# Patient Record
Sex: Male | Born: 1990
Health system: Southern US, Community
[De-identification: ages and names within clinical notes are randomized; demographics above are authoritative.]

## PROBLEM LIST (undated history)

## (undated) DIAGNOSIS — E1143 Type 2 diabetes mellitus with diabetic autonomic (poly)neuropathy: Secondary | ICD-10-CM

## (undated) DIAGNOSIS — E559 Vitamin D deficiency, unspecified: Secondary | ICD-10-CM

## (undated) DIAGNOSIS — K3184 Gastroparesis: Secondary | ICD-10-CM

## (undated) DIAGNOSIS — M419 Scoliosis, unspecified: Secondary | ICD-10-CM

## (undated) DIAGNOSIS — F329 Major depressive disorder, single episode, unspecified: Secondary | ICD-10-CM

## (undated) DIAGNOSIS — G629 Polyneuropathy, unspecified: Secondary | ICD-10-CM

## (undated) DIAGNOSIS — F32A Depression, unspecified: Secondary | ICD-10-CM

## (undated) HISTORY — DX: Gastroparesis: K31.84

## (undated) HISTORY — DX: Type 2 diabetes mellitus with diabetic autonomic (poly)neuropathy: E11.43

---
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
## 2012-02-15 ENCOUNTER — Inpatient Hospital Stay (HOSPITAL_BASED_OUTPATIENT_CLINIC_OR_DEPARTMENT_OTHER)
Admission: EM | Admit: 2012-02-15 | Discharge: 2012-02-18 | DRG: 919 | Disposition: A | Discharge status: Home / Self Care | Payer: Managed Care, Other (non HMO) | Attending: Internal Medicine | Admitting: Internal Medicine

## 2012-02-15 ENCOUNTER — Encounter (HOSPITAL_BASED_OUTPATIENT_CLINIC_OR_DEPARTMENT_OTHER): Payer: Self-pay | Admitting: Emergency Medicine

## 2012-02-15 DIAGNOSIS — F329 Major depressive disorder, single episode, unspecified: Secondary | ICD-10-CM

## 2012-02-15 DIAGNOSIS — E1069 Type 1 diabetes mellitus with other specified complication: Secondary | ICD-10-CM | POA: Diagnosis not present

## 2012-02-15 DIAGNOSIS — E1065 Type 1 diabetes mellitus with hyperglycemia: Secondary | ICD-10-CM | POA: Diagnosis not present

## 2012-02-15 DIAGNOSIS — E1010 Type 1 diabetes mellitus with ketoacidosis without coma: Secondary | ICD-10-CM | POA: Diagnosis present

## 2012-02-15 DIAGNOSIS — D72829 Elevated white blood cell count, unspecified: Secondary | ICD-10-CM | POA: Diagnosis present

## 2012-02-15 DIAGNOSIS — F32A Depression, unspecified: Secondary | ICD-10-CM

## 2012-02-15 DIAGNOSIS — Z794 Long term (current) use of insulin: Secondary | ICD-10-CM

## 2012-02-15 DIAGNOSIS — IMO0002 Reserved for concepts with insufficient information to code with codable children: Secondary | ICD-10-CM | POA: Diagnosis not present

## 2012-02-15 DIAGNOSIS — E1110 Type 2 diabetes mellitus with ketoacidosis without coma: Secondary | ICD-10-CM

## 2012-02-15 DIAGNOSIS — E871 Hypo-osmolality and hyponatremia: Secondary | ICD-10-CM | POA: Diagnosis present

## 2012-02-15 DIAGNOSIS — E1049 Type 1 diabetes mellitus with other diabetic neurological complication: Secondary | ICD-10-CM | POA: Diagnosis present

## 2012-02-15 DIAGNOSIS — F17200 Nicotine dependence, unspecified, uncomplicated: Secondary | ICD-10-CM | POA: Diagnosis present

## 2012-02-15 DIAGNOSIS — F332 Major depressive disorder, recurrent severe without psychotic features: Secondary | ICD-10-CM | POA: Diagnosis present

## 2012-02-15 DIAGNOSIS — E876 Hypokalemia: Secondary | ICD-10-CM | POA: Clinically undetermined

## 2012-02-15 DIAGNOSIS — T85694A Other mechanical complication of insulin pump, initial encounter: Principal | ICD-10-CM | POA: Diagnosis present

## 2012-02-15 DIAGNOSIS — Y849 Medical procedure, unspecified as the cause of abnormal reaction of the patient, or of later complication, without mention of misadventure at the time of the procedure: Secondary | ICD-10-CM | POA: Diagnosis present

## 2012-02-15 HISTORY — DX: Scoliosis, unspecified: M41.9

## 2012-02-15 HISTORY — DX: Depression, unspecified: F32.A

## 2012-02-15 HISTORY — DX: Major depressive disorder, single episode, unspecified: F32.9

## 2012-02-15 LAB — GLUCOSE, CAPILLARY
Glucose-Capillary: >600 mg/dL (ref 70–99)
Glucose-Capillary: 218 mg/dL — ABNORMAL HIGH (ref 70–99)
Glucose-Capillary: 126 mg/dL — ABNORMAL HIGH (ref 70–99)
Glucose-Capillary: 163 mg/dL — ABNORMAL HIGH (ref 70–99)
Glucose-Capillary: 381 mg/dL — ABNORMAL HIGH (ref 70–99)
Glucose-Capillary: 385 mg/dL — ABNORMAL HIGH (ref 70–99)
Glucose-Capillary: 315 mg/dL — ABNORMAL HIGH (ref 70–99)
Glucose-Capillary: 274 mg/dL — ABNORMAL HIGH (ref 70–99)

## 2012-02-15 LAB — BASIC METABOLIC PANEL
BUN: 14 mg/dL (ref 6–23)
BUN: 11 mg/dL (ref 6–23)
CO2: 12 mEq/L — ABNORMAL LOW (ref 19–32)
CO2: 15 mEq/L — ABNORMAL LOW (ref 19–32)
CO2: 19 mEq/L (ref 19–32)
CO2: 19 mEq/L (ref 19–32)
CO2: 17 mEq/L — ABNORMAL LOW (ref 19–32)
CO2: 17 mEq/L — ABNORMAL LOW (ref 19–32)
CO2: 21 mEq/L (ref 19–32)
CO2: 23 mEq/L (ref 19–32)
Calcium: 8.6 mg/dL (ref 8.4–10.5)
Calcium: 8 mg/dL — ABNORMAL LOW (ref 8.4–10.5)
Calcium: 8.5 mg/dL (ref 8.4–10.5)
Calcium: 8.6 mg/dL (ref 8.4–10.5)
Calcium: 8.5 mg/dL (ref 8.4–10.5)
Chloride: 82 mEq/L — ABNORMAL LOW (ref 96–112)
Chloride: 104 mEq/L (ref 96–112)
Chloride: 103 mEq/L (ref 96–112)
Chloride: 103 mEq/L (ref 96–112)
Chloride: 98 mEq/L (ref 96–112)
Chloride: 101 mEq/L (ref 96–112)
Chloride: 106 mEq/L (ref 96–112)
Creatinine, Ser: 0.56 mg/dL (ref 0.50–1.35)
Creatinine, Ser: 0.64 mg/dL (ref 0.50–1.35)
GFR calc Af Amer: >90 mL/min (ref >90)
GFR calc Af Amer: >90 mL/min (ref >90)
GFR calc Af Amer: >90 mL/min (ref >90)
GFR calc non Af Amer: >90 mL/min (ref >90)
GFR calc non Af Amer: >90 mL/min (ref >90)
GFR calc non Af Amer: >90 mL/min (ref >90)
GFR calc non Af Amer: >90 mL/min (ref >90)
Glucose, Bld: 191 mg/dL — ABNORMAL HIGH (ref 70–99)
Glucose, Bld: 171 mg/dL — ABNORMAL HIGH (ref 70–99)
Glucose, Bld: 145 mg/dL — ABNORMAL HIGH (ref 70–99)
Glucose, Bld: 382 mg/dL — ABNORMAL HIGH (ref 70–99)
Potassium: 5 mEq/L (ref 3.5–5.1)
Potassium: 3.3 mEq/L — ABNORMAL LOW (ref 3.5–5.1)
Potassium: 3.5 mEq/L (ref 3.5–5.1)
Potassium: 4.1 mEq/L (ref 3.5–5.1)
Potassium: 4.2 mEq/L (ref 3.5–5.1)
Potassium: 3.5 mEq/L (ref 3.5–5.1)
Potassium: 3.1 mEq/L — ABNORMAL LOW (ref 3.5–5.1)
Potassium: 3.4 mEq/L — ABNORMAL LOW (ref 3.5–5.1)
Sodium: 127 mEq/L — ABNORMAL LOW (ref 135–145)
Sodium: 132 mEq/L — ABNORMAL LOW (ref 135–145)
Sodium: 133 mEq/L — ABNORMAL LOW (ref 135–145)
Sodium: 128 mEq/L — ABNORMAL LOW (ref 135–145)
Sodium: 127 mEq/L — ABNORMAL LOW (ref 135–145)
Sodium: 130 mEq/L — ABNORMAL LOW (ref 135–145)
Sodium: 132 mEq/L — ABNORMAL LOW (ref 135–145)
Sodium: 138 mEq/L (ref 135–145)

## 2012-02-15 LAB — CBC WITH DIFFERENTIAL/PLATELET
Basophils Relative: 0 % (ref 0–1)
Eosinophils Absolute: 0 10*3/uL (ref 0.0–0.7)
Eosinophils Relative: 0 % (ref 0–5)
Hemoglobin: 13.7 g/dL (ref 13.0–17.0)
MCH: 29.9 pg (ref 26.0–34.0)
MCHC: 35.4 g/dL (ref 30.0–36.0)
MCV: 84.5 fL (ref 78.0–100.0)
Monocytes Relative: 6 % (ref 3–12)
Neutrophils Relative %: 82 % — ABNORMAL HIGH (ref 43–77)
Platelets: 326 10*3/uL (ref 150–400)

## 2012-02-15 LAB — URINALYSIS, ROUTINE W REFLEX MICROSCOPIC
Bilirubin Urine: NEGATIVE
Glucose, UA: >1000 mg/dL — AB
Hgb urine dipstick: NEGATIVE
Ketones, ur: >80 mg/dL — AB
Leukocytes, UA: NEGATIVE
Nitrite: NEGATIVE
Protein, ur: NEGATIVE mg/dL
Specific Gravity, Urine: 1.028 (ref 1.005–1.030)
Urobilinogen, UA: 0.2 mg/dL (ref 0.0–1.0)
pH: 5 (ref 5.0–8.0)

## 2012-02-15 LAB — POCT I-STAT 3, VENOUS BLOOD GAS (G3P V)
Patient temperature: 98.7
pH, Ven: 7.153 — CL (ref 7.250–7.300)

## 2012-02-15 LAB — URINE MICROSCOPIC-ADD ON

## 2012-02-15 LAB — CARDIAC PANEL(CRET KIN+CKTOT+MB+TROPI): CK, MB: 1.4 ng/mL (ref 0.3–4.0)

## 2012-02-15 MED ORDER — ENOXAPARIN SODIUM 40 MG/0.4ML ~~LOC~~ SOLN
40.0000 mg | SUBCUTANEOUS | Status: DC
  Administered 2012-02-15 – 2012-02-17 (×3): 40 mg via SUBCUTANEOUS
  Filled 2012-02-15 (×5): qty 0.4

## 2012-02-15 MED ORDER — POTASSIUM CHLORIDE 10 MEQ/100ML IV SOLN: 10.0000 meq | INTRAVENOUS | Status: DC

## 2012-02-15 MED ORDER — DEXTROSE 50 % IV SOLN: 25.0000 mL | INTRAVENOUS | Status: DC | PRN

## 2012-02-15 MED ORDER — SODIUM CHLORIDE 0.9 % IV SOLN: INTRAVENOUS | Status: DC

## 2012-02-15 MED ORDER — POTASSIUM CHLORIDE 10 MEQ/100ML IV SOLN
10.0000 meq | INTRAVENOUS | Status: AC
Start: 2012-02-15 — End: 2012-02-15
  Administered 2012-02-15 (×2): 10 meq via INTRAVENOUS
  Filled 2012-02-15 (×2): qty 100

## 2012-02-15 MED ORDER — INSULIN ASPART 100 UNIT/ML ~~LOC~~ SOLN
0.0000 [IU] | Freq: Three times a day (TID) | SUBCUTANEOUS | Status: DC
  Administered 2012-02-15: 3 [IU] via SUBCUTANEOUS

## 2012-02-15 MED ORDER — ONDANSETRON HCL 4 MG/2ML IJ SOLN: 4.0000 mg | Freq: Three times a day (TID) | INTRAMUSCULAR | Status: DC | PRN

## 2012-02-15 MED ORDER — INSULIN REGULAR HUMAN 100 UNIT/ML IJ SOLN
INTRAMUSCULAR | Status: AC
  Filled 2012-02-15: qty 1

## 2012-02-15 MED ORDER — SODIUM CHLORIDE 0.9 % IV SOLN
INTRAVENOUS | Status: DC
  Administered 2012-02-15: 5.4 [IU]/h via INTRAVENOUS

## 2012-02-15 MED ORDER — SODIUM CHLORIDE 0.9 % IV SOLN
INTRAVENOUS | Status: AC
  Administered 2012-02-15: 05:00:00 via INTRAVENOUS

## 2012-02-15 MED ORDER — ACETAMINOPHEN 325 MG PO TABS
650.0000 mg | ORAL_TABLET | Freq: Four times a day (QID) | ORAL | Status: DC | PRN
  Administered 2012-02-15 – 2012-02-16 (×3): 650 mg via ORAL
  Filled 2012-02-15 (×3): qty 2

## 2012-02-15 MED ORDER — INSULIN GLARGINE 100 UNIT/ML ~~LOC~~ SOLN: 10.0000 [IU] | Freq: Every day | SUBCUTANEOUS | Status: DC

## 2012-02-15 MED ORDER — SODIUM CHLORIDE 0.9 % IV SOLN
INTRAVENOUS | Status: DC
  Administered 2012-02-15: 3.3 [IU]/h via INTRAVENOUS
  Filled 2012-02-15: qty 1

## 2012-02-15 MED ORDER — DEXTROSE-NACL 5-0.45 % IV SOLN: INTRAVENOUS | Status: DC

## 2012-02-15 MED ORDER — SODIUM CHLORIDE 0.9 % IV BOLUS (SEPSIS)
2000.0000 mL | Freq: Once | INTRAVENOUS | Status: AC
  Administered 2012-02-15: 2000 mL via INTRAVENOUS

## 2012-02-15 MED ORDER — INSULIN ASPART 100 UNIT/ML ~~LOC~~ SOLN: 0.0000 [IU] | Freq: Every day | SUBCUTANEOUS | Status: DC

## 2012-02-15 MED ORDER — ONDANSETRON HCL 4 MG/2ML IJ SOLN: 4.0000 mg | Freq: Four times a day (QID) | INTRAMUSCULAR | Status: DC | PRN

## 2012-02-15 MED ORDER — SODIUM CHLORIDE 0.9 % IV SOLN
INTRAVENOUS | Status: DC
  Administered 2012-02-15: 125 mL/h via INTRAVENOUS
  Administered 2012-02-16: 02:00:00 via INTRAVENOUS
  Administered 2012-02-16: 75 mL/h via INTRAVENOUS
  Administered 2012-02-17: 03:00:00 via INTRAVENOUS
  Administered 2012-02-17: 75 mL/h via INTRAVENOUS

## 2012-02-15 MED ORDER — DEXTROSE-NACL 5-0.45 % IV SOLN
INTRAVENOUS | Status: DC
  Administered 2012-02-15: 06:00:00 via INTRAVENOUS

## 2012-02-15 MED ORDER — DEXTROSE 50 % IV SOLN
25.0000 mL | INTRAVENOUS | Status: DC | PRN
  Filled 2012-02-15: qty 50

## 2012-02-15 NOTE — ED Provider Notes (Signed)
History     CSN: [id number]  Arrival date & time 02/15/12  0101   First MD Initiated Contact with Patient 02/15/12 0113      Chief Complaint  Patient presents with  . Blood Sugar Problem    (Consider location/radiation/quality/duration/timing/severity/associated sxs/prior treatment) HPI This is a 21 year old white male with a bout 11 year history of type 1 diabetes. He has an insulin pump and he states it didn't attach itself from him during sleep probably several hours ago. He woke up about an hour ago with nausea and vomiting consistent with prior episodes of hyperglycemia. The sugar was noted to read off scale Hy arrival. He states he has a dry mouth and feels dehydrated. He has had frequent urination and generalized aches. He is also noted to be tachycardic. He denies abdominal pain.  Past Medical History  Diagnosis Date  . Diabetes mellitus   . Depression   . Scoliosis     History reviewed. No pertinent past surgical history.  No family history on file.  History  Substance Use Topics  . Smoking status: Current Everyday Smoker  . Smokeless tobacco: Not on file  . Alcohol Use: No      Review of Systems  All other systems reviewed and are negative.    Allergies  Review of patient's allergies indicates no known allergies.  Home Medications   Current Outpatient Rx  Name Route Sig Dispense Refill  . INSULIN ASPART 100 UNIT/ML Johnson SOLN Subcutaneous Inject into the skin.      BP 128/83  Pulse 124  Temp 97.5 F (36.4 C) (Oral)  Resp 18  Ht 5\' 9"  (1.753 m)  Wt 125 lb (56.7 kg)  BMI 18.46 kg/m2  SpO2 100%  Physical Exam General: Well-developed, well-nourished male in no acute distress; appearance consistent with age of record HENT: normocephalic, atraumatic; dry mucous membranes Eyes: pupils equal round and reactive to light; extraocular muscles intact Neck: supple Heart: regular rate and rhythm; tachycardic Lungs: clear to auscultation  bilaterally Abdomen: soft; nondistended; nontender; bowel sounds present Extremities: No deformity; full range of motion; pulses normal; no edema Neurologic: Awake, alert and oriented; motor function intact in all extremities and symmetric; no facial droop Skin: Warm and dry Psychiatric: Normal mood and affect    ED Course  Procedures (including critical care time)    MDM   Nursing notes and vitals signs, including pulse oximetry, reviewed.  Summary of this visit's results, reviewed by myself:  Labs:  Results for orders placed during the hospital encounter of 02/15/12  URINALYSIS, ROUTINE W REFLEX MICROSCOPIC      Component Value Range   Color, Urine YELLOW  YELLOW   APPearance CLEAR  CLEAR   Specific Gravity, Urine 1.028  1.005 - 1.030   pH 5.0  5.0 - 8.0   Glucose, UA >1000 (*) NEGATIVE mg/dL   Hgb urine dipstick NEGATIVE  NEGATIVE   Bilirubin Urine NEGATIVE  NEGATIVE   Ketones, ur >80 (*) NEGATIVE mg/dL   Protein, ur NEGATIVE  NEGATIVE mg/dL   Urobilinogen, UA 0.2  0.0 - 1.0 mg/dL   Nitrite NEGATIVE  NEGATIVE   Leukocytes, UA NEGATIVE  NEGATIVE  GLUCOSE, CAPILLARY      Component Value Range   Glucose-Capillary >600 (*) 70 - 99 mg/dL  URINE MICROSCOPIC-ADD ON      Component Value Range   Squamous Epithelial / LPF RARE  RARE   WBC, UA 0-2  <3 WBC/hpf   RBC / HPF 0-2  <3  RBC/hpf   Bacteria, UA RARE  RARE  POCT I-STAT 3, BLOOD GAS (G3P V)      Component Value Range   pH, Ven 7.153 (*) 7.250 - 7.300   pCO2, Ven 36.0 (*) 45.0 - 50.0 mmHg   pO2, Ven 36.0  30.0 - 45.0 mmHg   Bicarbonate 12.6 (*) 20.0 - 24.0 mEq/L   TCO2 14  0 - 100 mmol/L   O2 Saturation 53.0     Acid-base deficit 15.0 (*) 0.0 - 2.0 mmol/L   Patient temperature 98.7 F     Collection site IV START     Drawn by Nurse     Sample type VENOUS     Comment NOTIFIED PHYSICIAN     1:54 AM IV fluid bolus and insulin drip initiated for treatment of DKA.          Hanley Seamen, MD 02/15/12  [phone]

## 2012-02-15 NOTE — Progress Notes (Signed)
Glucose stabilizer restarted per MD order; CBG 385. Will continue to monitor.

## 2012-02-15 NOTE — Progress Notes (Signed)
Utilization review completed.  

## 2012-02-15 NOTE — Progress Notes (Addendum)
Inpatient Diabetes Program Recommendations  AACE/ADA: New Consensus Statement on Inpatient Glycemic Control (2013)  Target Ranges:  Prepandial:   less than 140 mg/dL      Peak postprandial:   less than 180 mg/dL (1-2 hours)      Critically ill patients:  140 - 180 mg/dL   Reason for Visit: Admitted in DKA.  Uses insulin pump at home.  Inpatient Diabetes Program Recommendations HgbA1C: No value known.  Request MD order.  Note: Patient has insulin pump in room.  Has no pump supplies.  Per patient, pump infusion set came out when he laid down last night.  When he got up about 10 PM was feeling nauseated.  (Had laid down about 7 PM)  Was not at home-- was at his cousin's, and he had no pump supplies.  His cousin will obtain supplies from patient's father and bring them today after he gets off work at 3:30 pm.  Patient currently does not have an endocrinologist.  Used to see the only endocrinologist in [place], but doesn't remember his name.  He is supposed to begin seeing another one, but does not remember that one's name either.  His PCP is Dr. Manson Passey in [place], [place].    Patient states that he has problems keeping his infusion sets in place.  Uses alcohol prep and IV prep prior to inserting set.  Nursing staff will give patient some Skin Prep to try with skin preparation whenever he is ready to resume pump.  Demonstrated to patient how to make a stress/safety loop which may help keep sets from pulling out so easily.  Last B-Met done at 1100.  GAP was 13 and CO2 was 17.  MD has ordered another B-met to check status.  Jolanta Cabeza S. Bailei Buist, RN, CNS, CDE  [phone])   Insulin pump settings are as follows:  Basal rates- 12 midnight =  0.75 units/hr and 7 am = 1.15 units/hr for a total of 20.075 units per 24 hours.  Insulin to CHO ratio is 1:8, sensitivity factor is 25, and target is 100 mg/dl. Eddye Broxterman S. Elsie Lincoln, RN, CNS, CDE  [phone])

## 2012-02-15 NOTE — H&P (Signed)
[facility] History and Physical  Jake Howell ZOX:[id number] DOB: 09/19/1990 DOA: 02/15/2012  Referring physician: [place] PCP: No primary provider on file.   Chief Complaint: dka  HPI:  21 yo male with dm1 had lost the attachment to his insulin pump and could not use it therefore went into dka.  + nv, went ot med ctr [facility] and transferred to [facility] for DKA.   Review of Systems:  Negative for all organ systems except for + above  Past Medical History  Diagnosis Date  . Diabetes mellitus   . Scoliosis   . Depression    History reviewed. No pertinent past surgical history. Social History:  reports that he has been smoking.  He does not have any smokeless tobacco history on file. He reports that he does not drink alcohol or use illicit drugs. Lives with parents Independent in ADLS  No Known Allergies  Family History  Problem Relation Age of Onset  . Crohn's disease Father     (be sure to complete)  Prior to Admission medications   Medication Sig Start Date End Date Taking? Authorizing Provider  insulin aspart (NOVOLOG) 100 UNIT/ML injection Inject into the skin.   Yes Historical Provider, MD   Physical Exam: Filed Vitals:   02/15/12 0106 02/15/12 0255 02/15/12 0359  BP: 128/83 120/73 116/67  Pulse: 124 108 117  Temp: 97.5 F (36.4 C) 98.4 F (36.9 C) 98.8 F (37.1 C)  TempSrc: Oral  Oral  Resp: 18 18 17   Height: 5\' 9"  (1.753 m)  5\' 9"  (1.753 m)  Weight: 56.7 kg (125 lb)  56.8 kg (125 lb 3.5 oz)  SpO2: 100%  100%     General: nad  Eyes: anicteric  ENT: mm dry  Neck: no jvd, no bruit, no tm, no adenopathy  Cardiovascular: rrr s1, s2  Respiratory: ctab  Abdomen: soft, nt, n,d, +bs  Skin: no rash, no c/c/e  Musculoskeletal: wnl  Psychiatric:axox3  Neurologic: nonfocal  Labs on Admission:  Basic Metabolic Panel:  Lab 02/15/12 8119  NA 127*  K 5.0  CL 82*  CO2 12*  GLUCOSE 751*  BUN 17  CREATININE 0.80  CALCIUM 9.7  MG --  PHOS --    Liver Function Tests: No results found for this basename: AST:5,ALT:5,ALKPHOS:5,BILITOT:5,PROT:5,ALBUMIN:5 in the last 168 hours No results found for this basename: LIPASE:5,AMYLASE:5 in the last 168 hours No results found for this basename: AMMONIA:5 in the last 168 hours CBC: No results found for this basename: WBC:5,NEUTROABS:5,HGB:5,HCT:5,MCV:5,PLT:5 in the last 168 hours Cardiac Enzymes: No results found for this basename: CKTOTAL:5,CKMB:5,CKMBINDEX:5,TROPONINI:5 in the last 168 hours  BNP (last 3 results) No results found for this basename: PROBNP:3 in the last 8760 hours CBG:  Lab 02/15/12 0244 02/15/12 0114  GLUCAP >600* >600*    Radiological Exams on Admission: No results found.  EKG:  Assessment/Plan Principal Problem:  *DKA (diabetic ketoacidoses)   1. DKA:  Iv insulin, ns iv   2. Pseudohyponatremia:   3. N/v:  zofran prn  Code Status: full code Family Communication: Leavy Cella  Disposition Plan: home  Time spent:  Keigo Whalley [facility] Pager [phone]  If 7PM-7AM, please contact night-coverage [email or web address] Password [facility] 02/15/2012, 4:38 AM

## 2012-02-15 NOTE — ED Notes (Signed)
Pt states his insulin pump ran out of insulin. Pt states blood sugar is elevated. Pt c/o nausea and vomiting.

## 2012-02-15 NOTE — Progress Notes (Signed)
Patient's CBG 60, per glucose stabilizer protocol 1 amp D50 given and MD notified.  Per MD glucose stabilizer d/c'd and other new orders received.  Will continue to monitor.

## 2012-02-15 NOTE — Progress Notes (Signed)
[facility]    I have examined the patient and evaluated his chart and admission history. Patient admitted early this morning with DKA due to problems with Insulin Pump- As he had and episode of hypoglycemia this AM, his IV insulin infusion was discontinued per the DKA protocol-  however due to inability to restart his insulin pump, the subsequent anion gap was noted to be increasing-  we have decided to resume the insulin infusion and DKA protocol until his personal pump supplies are brought to the hospital. Patient is in agreement with this plan.   Calvert Cantor, MD [phone]

## 2012-02-15 NOTE — ED Notes (Signed)
Pt is from out of town and thought he had more insulin in pump.

## 2012-02-16 DIAGNOSIS — E1049 Type 1 diabetes mellitus with other diabetic neurological complication: Secondary | ICD-10-CM | POA: Diagnosis present

## 2012-02-16 DIAGNOSIS — E876 Hypokalemia: Secondary | ICD-10-CM

## 2012-02-16 DIAGNOSIS — E1010 Type 1 diabetes mellitus with ketoacidosis without coma: Secondary | ICD-10-CM

## 2012-02-16 DIAGNOSIS — E1110 Type 2 diabetes mellitus with ketoacidosis without coma: Secondary | ICD-10-CM

## 2012-02-16 LAB — BASIC METABOLIC PANEL
CO2: 22 mEq/L (ref 19–32)
Chloride: 104 mEq/L (ref 96–112)
Creatinine, Ser: 0.53 mg/dL (ref 0.50–1.35)
Glucose, Bld: 142 mg/dL — ABNORMAL HIGH (ref 70–99)

## 2012-02-16 LAB — GLUCOSE, CAPILLARY
Glucose-Capillary: 105 mg/dL — ABNORMAL HIGH (ref 70–99)
Glucose-Capillary: 118 mg/dL — ABNORMAL HIGH (ref 70–99)
Glucose-Capillary: 121 mg/dL — ABNORMAL HIGH (ref 70–99)
Glucose-Capillary: 204 mg/dL — ABNORMAL HIGH (ref 70–99)
Glucose-Capillary: 227 mg/dL — ABNORMAL HIGH (ref 70–99)
Glucose-Capillary: 232 mg/dL — ABNORMAL HIGH (ref 70–99)
Glucose-Capillary: 62 mg/dL — ABNORMAL LOW (ref 70–99)

## 2012-02-16 MED ORDER — POTASSIUM CHLORIDE CRYS ER 20 MEQ PO TBCR
40.0000 meq | EXTENDED_RELEASE_TABLET | Freq: Two times a day (BID) | ORAL | Status: AC
  Administered 2012-02-16 – 2012-02-17 (×3): 40 meq via ORAL
  Filled 2012-02-16 (×4): qty 2

## 2012-02-16 MED ORDER — OXYCODONE HCL 5 MG PO TABS
5.0000 mg | ORAL_TABLET | ORAL | Status: DC | PRN
  Administered 2012-02-16 – 2012-02-17 (×9): 5 mg via ORAL
  Administered 2012-02-18: 10 mg via ORAL
  Filled 2012-02-16 (×8): qty 1
  Filled 2012-02-16: qty 2
  Filled 2012-02-16: qty 1

## 2012-02-16 MED ORDER — INSULIN PUMP
Freq: Three times a day (TID) | SUBCUTANEOUS | Status: DC
  Administered 2012-02-16: 10.01 via SUBCUTANEOUS
  Administered 2012-02-16: 7.7 via SUBCUTANEOUS
  Administered 2012-02-17: 08:00:00 via SUBCUTANEOUS
  Filled 2012-02-16: qty 1

## 2012-02-16 NOTE — Progress Notes (Signed)
Patient transferred to [facility] room 26 via wheelchair with NT; report called to RN on 6N and all questions answered.  Patient's assessment WNL and VSS.  Patient's family at bedside.

## 2012-02-16 NOTE — Progress Notes (Signed)
Patient transferred to [facility] room 26. Alert and oriented x3. NS infusing.  Medicated for pain in left shoulder prior to transfer. Oriented to room. Call bell in reach.

## 2012-02-16 NOTE — Progress Notes (Signed)
Hypoglycemic Event  CBG:62  Treatment: 15 GM carbohydrate snack  Symptoms: Shaky  Follow-up CBG: Time:0420 CBG Result:78  Possible Reasons for Event: Unknown  Comments/MD notified:snack given    Jake Howell  Remember to initiate Hypoglycemia Order Set & complete

## 2012-02-16 NOTE — Progress Notes (Signed)
[facility] Progress Note [place] TEAM 1 - Stepdown/ICU TEAM   Jake Howell ZOX:[id number] DOB: 10-23-1990 DOA: 02/15/2012 PCP: Dr. Manson Passey in [place]  Brief narrative: 21 yo male with dm1 had lost the attachment to his insulin pump and could not use it therefore went into dka - + nv, went to med ctr [facility] and transferred to [facility] for DKA.   Assessment/Plan:  DKA Gap is closed and did so rapidly with IV insulin - bicarbonate has normalized - the patient is no longer on the IV insulin drip - the cause of his DKA was noncompliance with insulin therapy as detailed above  Diabetes mellitus type 1 The patient is no longer in DKA but his CBGs remain somewhat erratic - we will transfer him to a medical bed but watch his CBGs overnight with resumption of his insulin pump to assure that everything is working properly  Hypokalemia Due to DKA and its treatment - replace and followup in a.m.  Migratory bodyache Will check CK total as patient would be a risk for rhabdo related to DKA  Depression  Code Status: Full Disposition Plan: Transfer to a medical bed  Consultants: None  Procedures: None  Antibiotics: None  DVT prophylaxis: SCDs  HPI/Subjective: The patient complains of migratory bodyaches to include right shoulder right arm and left lower extremity.  He describes these as achy.  He denies chest pain shortness of breath fevers chills nausea vomiting or abdominal pain.   Objective: Blood pressure 128/92, pulse 75, temperature 97.8 F (36.6 C), temperature source Oral, resp. rate 18, height 5\' 9"  (1.753 m), weight 56.8 kg (125 lb 3.5 oz), SpO2 100.00%.  Intake/Output Summary (Last 24 hours) at 02/16/12 1157 Last data filed at 02/16/12 1000  Gross per 24 hour  Intake    750 ml  Output      0 ml  Net    750 ml     Exam: General: No acute respiratory distress Lungs: Clear to auscultation bilaterally without wheezes or crackles Cardiovascular: Regular rate and  rhythm without murmur gallop or rub normal S1 and S2 Abdomen: Nontender, nondistended, soft, bowel sounds positive, no rebound, no ascites, no appreciable mass Extremities: No significant cyanosis, clubbing, or edema bilateral lower extremities  Data Reviewed: Basic Metabolic Panel:  Lab 02/16/12 8119 02/15/12 2309 02/15/12 2121 02/15/12 1949 02/15/12 1739  NA 138 138 132* 130* 127*  K 3.0* 3.4* 3.1* 3.5 4.2  CL 104 106 101 98 92*  CO2 22 23 21  17* 15*  GLUCOSE 142* 58* 130* 298* 409*  BUN 11 11 11 11 12   CREATININE 0.53 0.73 0.60 0.74 0.74  CALCIUM 8.1* 8.5 8.8 8.8 8.6  MG -- -- -- -- --  PHOS -- -- -- -- --   CBC:  Lab 02/15/12 0500  WBC 18.6*  NEUTROABS 15.3*  HGB 13.7  HCT 38.7*  MCV 84.5  PLT 326   CBG:  Lab 02/16/12 0751 02/16/12 0427 02/16/12 0409 02/16/12 0224 02/16/12 0107  GLUCAP 227* 78 62* 196* 204*    Recent Results (from the past 240 hour(s))  MRSA PCR SCREENING     Status: Normal   Collection Time   02/15/12  3:46 AM      Component Value Range Status Comment   MRSA by PCR NEGATIVE  NEGATIVE Final      Studies:  Recent x-ray studies have been reviewed in detail by the Attending Physician  Scheduled Meds:  Reviewed in detail by the Attending Physician  Lonia Blood, MD [facility] Office  [phone] Pager [phone]  On-Call/Text Page:      [email or web address]      password [email or web address]  If 7PM-7AM, please contact night-coverage [email or web address] Password [facility] 02/16/2012, 11:57 AM   LOS: 1 day

## 2012-02-16 NOTE — Progress Notes (Signed)
Hypoglycemic Event  CBG: 62  Treatment: 15 GM carbohydrate snack  Symptoms: Shaky  Follow-up CBG: Time:2354 CBG Result:97  Possible Reasons for Event: Medication regimen: Insulin gtt  Comments/MD notified:MD initiated Insulin pump    Jake Howell, Jake Howell  Remember to initiate Hypoglycemia Order Set & complete

## 2012-02-16 NOTE — Progress Notes (Signed)
Utilization review completed.  

## 2012-02-17 LAB — GLUCOSE, CAPILLARY
Glucose-Capillary: 143 mg/dL — ABNORMAL HIGH (ref 70–99)
Glucose-Capillary: 469 mg/dL — ABNORMAL HIGH (ref 70–99)

## 2012-02-17 LAB — CBC WITH DIFFERENTIAL/PLATELET
Basophils Absolute: 0 10*3/uL (ref 0.0–0.1)
Eosinophils Relative: 2 % (ref 0–5)
HCT: 39.1 % (ref 39.0–52.0)
Lymphocytes Relative: 39 % (ref 12–46)
Lymphs Abs: 2.7 10*3/uL (ref 0.7–4.0)
MCH: 29.5 pg (ref 26.0–34.0)
MCV: 86.1 fL (ref 78.0–100.0)
Monocytes Absolute: 0.8 10*3/uL (ref 0.1–1.0)
RDW: 13 % (ref 11.5–15.5)
WBC: 7 10*3/uL (ref 4.0–10.5)

## 2012-02-17 LAB — BASIC METABOLIC PANEL
BUN: 14 mg/dL (ref 6–23)
CO2: 27 mEq/L (ref 19–32)
CO2: 28 mEq/L (ref 19–32)
Calcium: 9.2 mg/dL (ref 8.4–10.5)
Chloride: 100 mEq/L (ref 96–112)
Chloride: 96 mEq/L (ref 96–112)
Creatinine, Ser: 1.05 mg/dL (ref 0.50–1.35)
GFR calc Af Amer: >90 mL/min (ref >90)
GFR calc non Af Amer: >90 mL/min (ref >90)
GFR calc non Af Amer: >90 mL/min (ref >90)
Glucose, Bld: 145 mg/dL — ABNORMAL HIGH (ref 70–99)
Glucose, Bld: 489 mg/dL — ABNORMAL HIGH (ref 70–99)
Potassium: 3.9 mEq/L (ref 3.5–5.1)
Potassium: 4.4 mEq/L (ref 3.5–5.1)
Sodium: 138 mEq/L (ref 135–145)
Sodium: 133 mEq/L — ABNORMAL LOW (ref 135–145)

## 2012-02-17 MED ORDER — INSULIN ASPART 100 UNIT/ML ~~LOC~~ SOLN
6.0000 [IU] | Freq: Three times a day (TID) | SUBCUTANEOUS | Status: DC
  Administered 2012-02-17 – 2012-02-18 (×3): 6 [IU] via SUBCUTANEOUS

## 2012-02-17 MED ORDER — INSULIN ASPART 100 UNIT/ML ~~LOC~~ SOLN: 0.0000 [IU] | Freq: Every day | SUBCUTANEOUS | Status: DC

## 2012-02-17 MED ORDER — INSULIN ASPART 100 UNIT/ML ~~LOC~~ SOLN
0.0000 [IU] | Freq: Three times a day (TID) | SUBCUTANEOUS | Status: DC
  Administered 2012-02-17: 3 [IU] via SUBCUTANEOUS
  Administered 2012-02-17: 2 [IU] via SUBCUTANEOUS
  Administered 2012-02-18: 5 [IU] via SUBCUTANEOUS
  Filled 2012-02-17: qty 0.09

## 2012-02-17 MED ORDER — INSULIN GLARGINE 100 UNIT/ML ~~LOC~~ SOLN
20.0000 [IU] | Freq: Every day | SUBCUTANEOUS | Status: DC
  Administered 2012-02-17: 20 [IU] via SUBCUTANEOUS

## 2012-02-17 NOTE — Progress Notes (Signed)
CRITICAL VALUE ALERT  Critical value received:  CBG 469  Date of notification:  02/17/2012  Time of notification:  2230      Critical value read back:yes  Nurse who received alert:  Garnette Scheuermann, RN  MD notified (1st page):  Merdis Delay, NP  Time of first page:  2240  MD notified (2nd page):  Time of second page:  Responding MD:  Merdis Delay, NP Time MD responded:  2240

## 2012-02-17 NOTE — Progress Notes (Signed)
Hypoglycemic Event  CBG: 58  Treatment: 15 GM carbohydrate snack  Symptoms: Sweaty  Follow-up CBG: GNFA:2130 CBG Result:107  Possible Reasons for Event: medication  Comments/MD notified:Schorr paged    Kendell Bane  Remember to initiate Hypoglycemia Order Set & complete

## 2012-02-17 NOTE — Progress Notes (Signed)
[facility] PROGRESS NOTE  Jake Howell WUJ:[id number] DOB: Oct 16, 1990 DOA: 02/15/2012   Assessment/Plan: DKA -Patient has clinically improved. His gap closed.  -He is tolerating his diet. Hypoglycemia -In reviewing his insulin pump, it appears the patient may be giving himself a bolus of insulin during the middle of the night; however, patient denies this -Changed Accu-Cheks to a.c. and at bedtime  Hypokalemia -This has resolved with supplementation Leukocytosis -Likely reactive from his DKA event -No signs or symptoms of sepsis - checking urine drug screen  Subjective: Patient is feeling better this morning. Denies any fevers, chills, chest pain, shortness of breath, nausea, vomiting, diarrhea, abdominal pain. He is tolerating his meals.  Objective: Filed Vitals:   02/16/12 1748 02/16/12 2124 02/17/12 0156 02/17/12 0600  BP: 135/95 130/78 119/70 122/83  Pulse: 77 87 71 73  Temp: 97.9 F (36.6 C) 98 F (36.7 C) 97.4 F (36.3 C) 97.5 F (36.4 C)  TempSrc: Oral Oral Oral Oral  Resp: 17 18 18 20   Height:      Weight:      SpO2: 99% 99% 100% 100%    Intake/Output Summary (Last 24 hours) at 02/17/12 0835 Last data filed at 02/17/12 0600  Gross per 24 hour  Intake   2760 ml  Output      0 ml  Net   2760 ml   Weight change:   Exam:  General: Alert, awake, oriented x3, in no acute distress.  HEENT: No bruits, no goiter. No thrush, no icterus Heart: Regular rate and rhythm, without murmurs, rubs, gallops.  Lungs: Good air movement, bilateral air movement. Clear to auscultation Abdomen: Soft, nontender, nondistended, positive bowel sounds.  Extremity : No cyanosis, clubbing, edema, rashes    Data Reviewed: Basic Metabolic Panel:  Lab 02/17/12 9562 02/16/12 0500 02/15/12 2309 02/15/12 2121 02/15/12 1949  NA 138 138 138 132* 130*  K 3.9 3.0* -- -- --  CL 100 104 106 101 98  CO2 27 22 23 21  17*  GLUCOSE 145* 142* 58* 130* 298*  BUN 8 11 11 11 11     CREATININE 0.50 0.53 0.73 0.60 0.74  CALCIUM 9.3 8.1* 8.5 8.8 8.8  MG -- -- -- -- --  PHOS -- -- -- -- --   Liver Function Tests: No results found for this basename: AST:5,ALT:5,ALKPHOS:5,BILITOT:5,PROT:5,ALBUMIN:5 in the last 168 hours No results found for this basename: LIPASE:5,AMYLASE:5 in the last 168 hours No results found for this basename: AMMONIA:5 in the last 168 hours CBC:  Lab 02/15/12 0500  WBC 18.6*  NEUTROABS 15.3*  HGB 13.7  HCT 38.7*  MCV 84.5  PLT 326   Cardiac Enzymes:  Lab 02/16/12 1324 02/15/12 0500  CKTOTAL 30 27  CKMB -- 1.4  CKMBINDEX -- --  TROPONINI -- <0.30   BNP: No components found with this basename: POCBNP:5 CBG:  Lab 02/17/12 0748 02/17/12 0556 02/17/12 0423 02/17/12 0353 02/17/12 0153  GLUCAP 143* 158* 107* 58* 81    Recent Results (from the past 240 hour(s))  MRSA PCR SCREENING     Status: Normal   Collection Time   02/15/12  3:46 AM      Component Value Range Status Comment   MRSA by PCR NEGATIVE  NEGATIVE Final      Studies: No results found.  Scheduled Meds:   . enoxaparin  40 mg Subcutaneous Q24H  . insulin pump   Subcutaneous TID AC, HS, 0200  . potassium chloride  40 mEq Oral BID  Continuous Infusions:   . sodium chloride 75 mL/hr at 02/17/12 0300    Catarina Hartshorn, DO  [facility] Pager [phone]  If 7PM-7AM, please contact night-coverage [email or web address] Password [facility] 02/17/2012, 8:35 AM

## 2012-02-17 NOTE — Consult Note (Signed)
Patient Identification:  Jake Howell Date of Evaluation:  02/17/2012 Reason for Consult:  Depression    Referring Provider:  History of Present Illness: Patient is awake sitting up and states that he has an insulin pump that was dislodged and when the pump was established the settings were wrong and his levels of blood glucose and insulin were incorrect.  He says that he has had the pump for some time and his diagnosis of diabetes mellitus I was diagnosed at age 21.   Past Psychiatric History: About the time of his diagnosis of DM I his parents were divorced and he vaguely remembers going to therapy at that time. He states that he has not been to therapy since.  Past Medical History:     Past Medical History  Diagnosis Date  . Diabetes mellitus   . Scoliosis   . Depression       History reviewed. No pertinent past surgical history.  Allergies: No Known Allergies  Current Medications:  Prior to Admission medications   Medication Sig Start Date End Date Taking? Authorizing Provider  Insulin Human (INSULIN PUMP) 100 unit/ml SOLN Inject into the skin continuous. novolog insulin   Yes Historical Provider, MD    Social History:    reports that he has been smoking.  He does not have any smokeless tobacco history on file. He reports that he does not drink alcohol or use illicit drugs.   Family History:    Family History  Problem Relation Age of Onset  . Crohn's disease Father     Mental Status Examination/Evaluation: Objective:  Appearance: Fairly Groomed and Neat  Psychomotor Activity:  Normal  Eye Contact::  Good  Speech:  Clear and Coherent  Volume:  Normal  Mood:  Depressed and Dysphoric  Affect:  Blunt and Depressed  Thought Process:  Coherent, Relevant and Intact  Orientation:  Full  Thought Content:  Focused on recovery and earning his GED   Suicidal Thoughts:  Yes.  without intent/plan  Homicidal Thoughts:  No  Judgement:  Fair  Insight:  Fair    DIAGNOSIS:    AXIS I  Major Depression, recurrent, moderate,   AXIS II  Deffered  AXIS III See medical notes.  AXIS IV economic problems, educational problems, occupational problems, other psychosocial or environmental problems, problems related to social environment and Medical and psychiatric supervision  AXIS V 51-60 moderate symptoms   Assessment/Plan:   Patient is very cooperative in discussing his current and remote life issues. He has had an insulin pump  for DM I.  He says when it fell out, he has had a lot of trouble managing blood glucose. There were times when he didn't feel like eating in the insulin would be too high and vice versa when it fell out, he would not get insulin and his blood sugar with the extremely high. At this time he would be very nauseous and vomit. He doesn't have any explanation of why this is a repetitive pattern. He also states that he has been very depressed, probably, since his parents divorced. His mother has a boyfriend and they have 2 children and father remarried and he and stepmother have one sister. He has 2 biologic siblings. He lives with his father A couple of years ago, he was expelled because he brought alcohol on the school bus for a friend. The friend started passing around and he was identified as the provider. He is wanting to complete his GED and is hoping to  earn enough money that he can pay for the test. He refers to this event as a "poor decision". He says and has depressed moments he has been cutting on his arm. He denies any active suicide thoughts at this time. He has no homicidal thoughts. He states he does not own a weapon. He has had some work experience but lost his job for for lack of calling in and showing up for work. He had a responsible job but said he would have one of his hyperglycemic attacks feel nauseous and so ill that he forgot to call.    Up to this time he has not taken any antidepressant medication.  The purpose of antidepressant  medication is described in terms of its intended benefit in need to report any side effects should they occur. The type of antidepressant medication is also explained. Due to his interest in earning his GED, which will require study, the [facility], Wellbutrin XL is as explained in terms of its benefit to relieve depression and a students in their efforts to study.  He expresses interest and willingness to try antidepressant medication. He also states that he would be willing to see a therapist for the several "unstated" factors that contribute to his depression. RECOMMENDATION: 1.  In this initial session there is no indication drive from this conversation that he is repeated episodes of DKA are intentionally created. 2.  Patient has capacity to comprehend and participate in his medical treatment and in therapy. 3.  Suggest Wellbutrin XL 150 mg daily in a.m. (At M.D. discretion, may begin at 75 mg daily and titrate to 150 mg) 4.   Suggest transfer to home when medically stable with  appointment with community psychiatrist and therapist if possible. 5.  No further psychiatric needs identified unless requested. M.D. psychiatrist signs off Harshan Kearley J. Ferol Luz, MD Psychiatrist . 02/17/2012 11:30 PM

## 2012-02-17 NOTE — Progress Notes (Signed)
UR completed 

## 2012-02-17 NOTE — Progress Notes (Addendum)
Inpatient Diabetes Program Recommendations  AACE/ADA: New Consensus Statement on Inpatient Glycemic Control (2013)  Target Ranges:  Prepandial:   less than 140 mg/dL      Peak postprandial:   less than 180 mg/dL (1-2 hours)      Critically ill patients:  140 - 180 mg/dL    Inpatient Diabetes Program Recommendations Insulin - Basal: Lantus 20 now (then discontinue pump) Correction (SSI): Sensitive TID ac + hs scale  Insulin - Meal Coverage: Novolog 6 units TID  HgbA1C: pending   Diabetes Coordinator spoke with patient concerning hypoglycemia this morning CBG=58.  Patient did not offer any explanation.  We looked at the insulin pump boluses together and there were 5 boluses since midnight with no carbohydrate entry for 3 of the 5 boluses.  None of the boluses included what the CBG was at that time.  The patient seems to believe that there is something wrong with the pump.  I have contacted the [employer] rep to interrogate the pump and download the boluses.  She will come this afternoon around 1.   After speaking with Dr. Arbutus Leas, the decision was made to remove the pump and start basal/bolus subcutaneous insulin until the patient is discharged.   The patient will need to follow-up with an endocrinologist for pump management.  The [employer] rep gave the name and number for Dr. Warden Fillers in [place].  When I mentioned this to the patient he said that is who he was seeing before.  He said he has the contact information for a different doctor in [place].  Will continue to follow during this admission.    Thank you  Piedad Climes RN,BSN,CDE Inpatient Diabetes Coordinator [phone]  ADD: The date and time was not correct when I looked at the pump this morning.  The time was approximately 45 minutes slow and the date was Aug. 20.  The patient corrected both the date and time and changed the battery.   The [employer] rep, Nancie Neas downloaded his pump and discovered that he is not entering his  CBG frequently enough, nor is he using it to bolus much at all until last night at midnight.  He definitely needs to followup ASAP with an endocrinologist.  Ms. Susette Racer told him about the free classes she has at [facility] every month. She said she would meet with him one-on-one for more in-depth education on his pump.  Her contact information will be given to the patient as well as the class schedule.

## 2012-02-18 ENCOUNTER — Inpatient Hospital Stay (HOSPITAL_COMMUNITY): Payer: Managed Care, Other (non HMO)

## 2012-02-18 DIAGNOSIS — F32A Depression, unspecified: Secondary | ICD-10-CM

## 2012-02-18 DIAGNOSIS — F331 Major depressive disorder, recurrent, moderate: Secondary | ICD-10-CM

## 2012-02-18 DIAGNOSIS — F329 Major depressive disorder, single episode, unspecified: Secondary | ICD-10-CM

## 2012-02-18 LAB — GLUCOSE, CAPILLARY
Glucose-Capillary: 210 mg/dL — ABNORMAL HIGH (ref 70–99)
Glucose-Capillary: 222 mg/dL — ABNORMAL HIGH (ref 70–99)
Glucose-Capillary: 522 mg/dL — ABNORMAL HIGH (ref 70–99)

## 2012-02-18 MED ORDER — SODIUM CHLORIDE 0.9 % IV BOLUS (SEPSIS)
500.0000 mL | Freq: Once | INTRAVENOUS | Status: AC
  Administered 2012-02-18: 500 mL via INTRAVENOUS

## 2012-02-18 MED ORDER — OXYCODONE HCL 5 MG PO TABS: 5.0000 mg | ORAL_TABLET | ORAL | Status: AC | PRN

## 2012-02-18 MED ORDER — BUPROPION HCL 75 MG PO TABS: 75.0000 mg | ORAL_TABLET | Freq: Every morning | ORAL | Status: DC

## 2012-02-18 MED ORDER — BUPROPION HCL ER (XL) 150 MG PO TB24: 150.0000 mg | ORAL_TABLET | Freq: Every day | ORAL | Status: DC

## 2012-02-18 MED ORDER — INSULIN PUMP
Freq: Three times a day (TID) | SUBCUTANEOUS | Status: DC
  Administered 2012-02-18: 12 via SUBCUTANEOUS
  Filled 2012-02-18: qty 1

## 2012-02-18 MED ORDER — INSULIN ASPART 100 UNIT/ML ~~LOC~~ SOLN
8.0000 [IU] | Freq: Once | SUBCUTANEOUS | Status: AC
  Administered 2012-02-18: 8 [IU] via SUBCUTANEOUS

## 2012-02-18 MED ORDER — INSULIN ASPART 100 UNIT/ML ~~LOC~~ SOLN
1000.0000 [IU] | Freq: Once | SUBCUTANEOUS | Status: AC
  Administered 2012-02-18: 1000 [IU] via SUBCUTANEOUS

## 2012-02-18 NOTE — Discharge Summary (Signed)
Physician Discharge Summary  Jake Howell ZOX:[id number] DOB: 05-Mar-1991 DOA: 02/15/2012  PCP: No primary provider on file.  Admit date: 02/15/2012 Discharge date: 02/18/2012  Recommendations for Outpatient Follow-up:  1. Pt will need to follow up with PCP in 1 week post discharge 2. Please obtain BMP to evaluate electrolytes and kidney function  Discharge Diagnoses:  DKA  Hypoglycemia  -In reviewing his insulin pump, it appears the patient was giving himself a bolus of insulin during the middle of the night; however, patient denies this  Hypokalemia  -This has resolved with supplementation  Leukocytosis  -Likely reactive from his DKA event  -No signs or symptoms of sepsis  -resolved Depression -no suicidal or homicidal ideation -psychiatry saw pt, recommened d/c with wellbutrin and outpt therapist f/u Shoulder pain -No signs of infection -CPK and x-rays were negative  Discharge Condition: Stable  Disposition:  discharged home  Diet: 1800-calorie ADA Wt Readings from Last 3 Encounters:  02/15/12 56.8 kg (125 lb 3.5 oz) (7.30%*)   * Growth percentiles are based on CDC 2-20 Years data.    History of present illness:  21 year old male who had been at his friend's house states that he had dislodged his insulin pump and did not have any other supplies to reinitiate therapy. The patient presented with nausea, vomiting, abdominal pain.  Hospital Course:  The patient was admitted to the step down unit. An insulin drip and intravenous fluids were initiated. Serial blood sugars were monitored as well as serial metabolic panels. The patient's anion gap eventually closed. The patient was then subsequently transitioned to an insulin pump. However, he had intermittent episodes of hypoglycemia. As a result the patient was kept in the hospital for an extra 24 hours. After interrogation of his insulin pump, it was noted that the patient was giving himself boluses during the middle of the night  without entering his blood sugar and without notifying the nursing staff. As a result, a decision was made to discontinue the insulin pump and start the patient on basal bolus dosing of Lantus and NovoLog. A psychiatry consult was obtained. Recommendation was to start the patient on Wellbutrin with titration up to 150 mg daily. He was also recommended that the patient followup with a community psychiatrist and therapist of possible He was also revealed that the patient was very depressed since his parent's divorce. There were no suicidal or homicidal ideations.  An attempt was made to schedule followup appointment with [facility]; however, he informed me that they would only take referrals from the primary care physician and that they will inform the patient wants his appointment is scheduled. I strongly encouraged the patient to follow up with his primary care provider. On the day of discharge, the patient was clinically stable. His insulin pump was restarted at his previous settings with the assistance of the diabetes coordinator. His basal rate from 12 AM to 7 AM is 0.075 units per hour and from 7 AM to 12 AM, it is 1.15 units per hour.  Education and diet were provided by the diabetes coordinator. The patient also began complaining of bilateral myalgias of his bicep and tricep area as well as left shoulder. X-rays were obtained of these areas and were negative. CPK was checked and it was 38. Hemoglobin A1c was 12.2.  Discharge Exam: Filed Vitals:   02/18/12 1402  BP: 127/69  Pulse: 81  Temp: 98.2 F (36.8 C)  Resp:    Filed Vitals:   02/17/12 2216 02/18/12 0107  02/18/12 0646 02/18/12 1402  BP: 119/65 116/75 109/60 127/69  Pulse: 91 86 65 81  Temp: 98 F (36.7 C) 98.1 F (36.7 C) 97.5 F (36.4 C) 98.2 F (36.8 C)  TempSrc: Oral Axillary Oral   Resp: 20 18 18    Height:      Weight:      SpO2: 97% 100% 95% 99%   General: Alert and oriented x3, no apparent  distress Cardiovascular: Regular in rhythm, no rubs or gallops Respiratory: Clear to auscultation no wheezes or rhonchi Abdomen: Soft, nontender, there are bowel sounds, no distention    Discharge Orders    Future Orders Please Complete By Expires   Diet - low sodium heart healthy      Increase activity slowly      Discharge instructions      Comments:   followup with primary care doctor in 1 week wellbutrin 75mg  daily for one week wellbutrin 150mg  daily AFTER finishing one week of 75mg  dose     Medication List  As of 02/18/2012  2:03 PM   TAKE these medications         buPROPion 75 MG tablet   Commonly known as: WELLBUTRIN   Take 1 tablet (75 mg total) by mouth every morning.      buPROPion 150 MG 24 hr tablet   Commonly known as: WELLBUTRIN XL   Take 1 tablet (150 mg total) by mouth daily after breakfast.      insulin pump 100 unit/ml Soln   Inject into the skin continuous. novolog insulin      oxyCODONE 5 MG immediate release tablet   Commonly known as: Oxy IR/ROXICODONE   Take 1 tablet (5 mg total) by mouth every 4 (four) hours as needed.             The results of significant diagnostics from this hospitalization (including imaging, microbiology, ancillary and laboratory) are listed below for reference.    Significant Diagnostic Studies: Dg Shoulder Left  02/18/2012  *RADIOLOGY REPORT*  Clinical Data: Shoulder pain.  LEFT SHOULDER - 2+ VIEW  Comparison: None.  Findings: No acute bony abnormality.  Specifically, no fracture, subluxation, or dislocation.  Soft tissues are intact. Joint spaces are maintained.  Normal bone mineralization.  IMPRESSION: Normal study.   Original Report Authenticated By: Cyndie Chime, M.D.    Dg Humerus Left  02/18/2012  *RADIOLOGY REPORT*  Clinical Data: Bilateral upper arm pain.  No known injury.  LEFT HUMERUS - 2+ VIEW  Comparison: None.  Findings: The mineralization and alignment are normal.  There is no evidence of acute fracture  or dislocation.  No soft tissue abnormalities are identified.  The joint spaces appear maintained.  IMPRESSION: Normal examination.   Original Report Authenticated By: Gerrianne Scale, M.D.    Dg Humerus Right  02/18/2012  *RADIOLOGY REPORT*  Clinical Data: Bilateral upper arm pain.  No known injury.  RIGHT HUMERUS - 2+ VIEW  Comparison: None.  Findings: The mineralization and alignment are normal.  There is no evidence of acute fracture or dislocation.  No soft tissue abnormalities are identified.  The joint spaces are maintained.  IV tubing is noted in the antecubital fossa.  IMPRESSION: Normal examination.   Original Report Authenticated By: Gerrianne Scale, M.D.      Microbiology: Recent Results (from the past 240 hour(s))  MRSA PCR SCREENING     Status: Normal   Collection Time   02/15/12  3:46 AM  Component Value Range Status Comment   MRSA by PCR NEGATIVE  NEGATIVE Final      Labs: Basic Metabolic Panel:  Lab 02/17/12 4098 02/17/12 0922 02/17/12 0657 02/16/12 0500 02/15/12 2309 02/15/12 2121  NA 133* -- 138 138 138 132*  K 4.4 -- 3.9 -- -- --  CL 96 -- 100 104 106 101  CO2 28 -- 27 22 23 21   GLUCOSE 489* -- 145* 142* 58* 130*  BUN 14 -- 8 11 11 11   CREATININE 1.05 -- 0.50 0.53 0.73 0.60  CALCIUM 9.2 -- 9.3 8.1* 8.5 8.8  MG -- 1.9 -- -- -- --  PHOS -- -- -- -- -- --   Liver Function Tests: No results found for this basename: AST:5,ALT:5,ALKPHOS:5,BILITOT:5,PROT:5,ALBUMIN:5 in the last 168 hours No results found for this basename: LIPASE:5,AMYLASE:5 in the last 168 hours No results found for this basename: AMMONIA:5 in the last 168 hours CBC:  Lab 02/17/12 0922 02/15/12 0500  WBC 7.0 18.6*  NEUTROABS 3.3 15.3*  HGB 13.4 13.7  HCT 39.1 38.7*  MCV 86.1 84.5  PLT 280 326   Cardiac Enzymes:  Lab 02/16/12 1324 02/15/12 0500  CKTOTAL 30 27  CKMB -- 1.4  CKMBINDEX -- --  TROPONINI -- <0.30   BNP: No components found with this basename: POCBNP:5 CBG:  Lab  02/18/12 1150 02/18/12 0807 02/18/12 0643 02/18/12 0430 02/18/12 0235  GLUCAP 211* 273* 210* 149* 222*    Time coordinating discharge:  Greater than 30 minutes  Signed:  Tiajuana Leppanen, DO [facility] Pager: [phone] 02/18/2012, 2:03 PM

## 2012-02-18 NOTE — Progress Notes (Signed)
Pt discharged home with father. Discharge instructions explained pt and father verbalized understanding.

## 2012-02-18 NOTE — Progress Notes (Addendum)
Inpatient Diabetes Program Recommendations  AACE/ADA: New Consensus Statement on Inpatient Glycemic Control (2013)  Target Ranges:  Prepandial:   less than 140 mg/dL      Peak postprandial:   less than 180 mg/dL (1-2 hours)      Critically ill patients:  140 - 180 mg/dL   Diabetes coordinator spoke with patient this morning concerning hyperglycemia last night.  Patient states that he had a late snack and did not have any insulin to cover it.   ADD: 12:20pm Diabetes Coordinator witnessed patient restart insulin pump.  All procedures/technique followed appropriately. [place] fill amount was changed to 0.3 units by patient.  Patient bolused 4.4 units for CBG=211 at 11:50 a.m.  Will follow.  Thank you  Piedad Climes [facility] Inpatient Diabetes Coordinator [phone]

## 2012-02-21 LAB — GLUCOSE, CAPILLARY: Glucose-Capillary: 274 mg/dL — ABNORMAL HIGH (ref 70–99)

## 2013-01-27 DIAGNOSIS — R45851 Suicidal ideations: Secondary | ICD-10-CM | POA: Insufficient documentation

## 2013-03-17 ENCOUNTER — Encounter (HOSPITAL_BASED_OUTPATIENT_CLINIC_OR_DEPARTMENT_OTHER): Payer: Self-pay | Admitting: *Deleted

## 2013-03-17 ENCOUNTER — Emergency Department (HOSPITAL_BASED_OUTPATIENT_CLINIC_OR_DEPARTMENT_OTHER)
Admission: EM | Admit: 2013-03-17 | Discharge: 2013-03-17 | Disposition: A | Discharge status: Home / Self Care | Payer: Managed Care, Other (non HMO) | Attending: Emergency Medicine | Admitting: Emergency Medicine

## 2013-03-17 DIAGNOSIS — F3289 Other specified depressive episodes: Secondary | ICD-10-CM | POA: Insufficient documentation

## 2013-03-17 DIAGNOSIS — Z8739 Personal history of other diseases of the musculoskeletal system and connective tissue: Secondary | ICD-10-CM | POA: Insufficient documentation

## 2013-03-17 DIAGNOSIS — F17200 Nicotine dependence, unspecified, uncomplicated: Secondary | ICD-10-CM | POA: Insufficient documentation

## 2013-03-17 DIAGNOSIS — K089 Disorder of teeth and supporting structures, unspecified: Secondary | ICD-10-CM | POA: Insufficient documentation

## 2013-03-17 DIAGNOSIS — Z79899 Other long term (current) drug therapy: Secondary | ICD-10-CM | POA: Insufficient documentation

## 2013-03-17 DIAGNOSIS — Z794 Long term (current) use of insulin: Secondary | ICD-10-CM | POA: Insufficient documentation

## 2013-03-17 DIAGNOSIS — E119 Type 2 diabetes mellitus without complications: Secondary | ICD-10-CM | POA: Insufficient documentation

## 2013-03-17 DIAGNOSIS — F329 Major depressive disorder, single episode, unspecified: Secondary | ICD-10-CM | POA: Insufficient documentation

## 2013-03-17 DIAGNOSIS — K0889 Other specified disorders of teeth and supporting structures: Secondary | ICD-10-CM

## 2013-03-17 DIAGNOSIS — K029 Dental caries, unspecified: Secondary | ICD-10-CM | POA: Insufficient documentation

## 2013-03-17 MED ORDER — OXYCODONE-ACETAMINOPHEN 5-325 MG PO TABS
1.0000 | ORAL_TABLET | ORAL | Status: AC
  Administered 2013-03-17: 1 via ORAL
  Filled 2013-03-17 (×2): qty 1

## 2013-03-17 NOTE — ED Provider Notes (Signed)
CSN: [id number]     Arrival date & time 03/17/13  1102 History   First MD Initiated Contact with Patient 03/17/13 1149     Chief Complaint  Patient presents with  . Dental Pain   (Consider location/radiation/quality/duration/timing/severity/associated sxs/prior Treatment) Patient is a 22 y.o. male presenting with tooth pain. The history is provided by the patient.  Dental Pain Location:  Lower Lower teeth location:  17/LL 3rd molar Quality:  Dull Severity:  Moderate Onset quality:  Gradual Duration:  1 week Timing:  Constant Progression:  Unchanged Chronicity:  New Previous work-up:  Filled cavity Relieved by:  Nothing Worsened by:  Nothing tried Ineffective treatments:  None tried Associated symptoms: no drooling, no fever, no headaches and no neck pain     Past Medical History  Diagnosis Date  . Diabetes mellitus   . Scoliosis   . Depression    History reviewed. No pertinent past surgical history. Family History  Problem Relation Age of Onset  . Crohn's disease Father    History  Substance Use Topics  . Smoking status: Current Every Day Smoker -- 0.50 packs/day for 1 years  . Smokeless tobacco: Not on file  . Alcohol Use: No    Review of Systems  Constitutional: Negative for fever.  HENT: Negative for rhinorrhea, drooling and neck pain.   Eyes: Negative for pain.  Respiratory: Negative for cough and shortness of breath.   Cardiovascular: Negative for chest pain and leg swelling.  Gastrointestinal: Negative for nausea, vomiting, abdominal pain and diarrhea.  Genitourinary: Negative for dysuria and hematuria.  Musculoskeletal: Negative for gait problem.  Skin: Negative for color change.  Neurological: Negative for numbness and headaches.  Hematological: Negative for adenopathy.  Psychiatric/Behavioral: Negative for behavioral problems.  All other systems reviewed and are negative.    Allergies  Review of patient's allergies indicates no known  allergies.  Home Medications   Current Outpatient Rx  Name  Route  Sig  Dispense  Refill  . EXPIRED: buPROPion (WELLBUTRIN XL) 150 MG 24 hr tablet   Oral   Take 1 tablet (150 mg total) by mouth daily after breakfast.   30 tablet   0     Start  Only after finishing 75mg  dose   . EXPIRED: buPROPion (WELLBUTRIN) 75 MG tablet   Oral   Take 1 tablet (75 mg total) by mouth every morning.   7 tablet   0   . Insulin Human (INSULIN PUMP) 100 unit/ml SOLN   Subcutaneous   Inject into the skin continuous. novolog insulin          BP 129/79  Pulse 115  Temp(Src) 98.1 F (36.7 C) (Oral)  Resp 18  Ht 5\' 9"  (1.753 m)  Wt 125 lb (56.7 kg)  BMI 18.45 kg/m2  SpO2 100% Physical Exam  Nursing note and vitals reviewed. Constitutional: He is oriented to person, place, and time. He appears well-developed and well-nourished.  HENT:  Head: Normocephalic and atraumatic.  Right Ear: External ear normal.  Left Ear: External ear normal.  Nose: Nose normal.  Mouth/Throat: Oropharynx is clear and moist. No oropharyngeal exudate.  Dental caries. No peri-apical or oral abscess. Mild ttp of left lower 3rd molar.   Mild to moderate swelling of left lateral anterior cervical lymph node.   Eyes: Conjunctivae and EOM are normal. Pupils are equal, round, and reactive to light.  Neck: Normal range of motion. Neck supple.  Cardiovascular: Normal rate, regular rhythm, normal heart sounds and intact distal pulses.  Exam reveals no gallop and no friction rub.   No murmur heard. Pulmonary/Chest: Effort normal and breath sounds normal. No respiratory distress. He has no wheezes.  Abdominal: Soft. Bowel sounds are normal. He exhibits no distension. There is no tenderness. There is no rebound and no guarding.  Musculoskeletal: Normal range of motion. He exhibits no edema and no tenderness.  Neurological: He is alert and oriented to person, place, and time.  Skin: Skin is warm and dry.  Psychiatric: He has a  normal mood and affect. His behavior is normal.    ED Course  Procedures (including critical care time) Labs Review Labs Reviewed - No data to display Imaging Review No results found.  MDM   1. Pain, dental    12:05 PM 22 y.o. male who presents with dental pain for approximately one week. The patient states that he saw dentist several days ago who recommended route canal and extraction of his wisdom teeth. The dentist provided a prescription for antibiotics which the patient has not started taking it. He denies any fevers and otherwise appears well on exam. Will recommend that the patient start taking the antibiotics. Will give one Percocet for pain here and provide resources for low cost dental followup.   12:07 PM: Will rec pt start taking the abx prescribed by his dentist. I have discussed the diagnosis/risks/treatment options with the patient and believe the pt to be eligible for discharge home to follow-up with a dentist asap. We also discussed returning to the ED immediately if new or worsening sx occur. We discussed the sx which are most concerning (e.g., worsening pain, fever, inc swelling) that necessitate immediate return. Any new prescriptions provided to the patient are listed below.  New Prescriptions   No medications on file     Junius Argyle, MD 03/17/13 [phone]

## 2013-03-17 NOTE — ED Notes (Signed)
Infection in left bottom side teeth, does not the money for a root canal

## 2013-04-10 LAB — HIV ANTIBODY (ROUTINE TESTING W REFLEX): HIV 1&2 Ab, 4th Generation: NEGATIVE

## 2013-06-25 ENCOUNTER — Ambulatory Visit: Payer: Managed Care, Other (non HMO) | Admitting: *Deleted

## 2014-11-28 ENCOUNTER — Encounter: Payer: Self-pay | Admitting: Physician Assistant

## 2015-07-29 ENCOUNTER — Encounter: Payer: Self-pay | Admitting: Family Medicine

## 2015-07-29 ENCOUNTER — Ambulatory Visit (INDEPENDENT_AMBULATORY_CARE_PROVIDER_SITE_OTHER): Payer: Managed Care, Other (non HMO) | Admitting: Family Medicine

## 2015-07-29 VITALS — BP 139/102 | HR 110 | Ht 69.0 in | Wt 116.0 lb

## 2015-07-29 DIAGNOSIS — R202 Paresthesia of skin: Secondary | ICD-10-CM

## 2015-07-29 DIAGNOSIS — R030 Elevated blood-pressure reading, without diagnosis of hypertension: Secondary | ICD-10-CM

## 2015-07-29 DIAGNOSIS — E1040 Type 1 diabetes mellitus with diabetic neuropathy, unspecified: Secondary | ICD-10-CM

## 2015-07-29 DIAGNOSIS — F329 Major depressive disorder, single episode, unspecified: Secondary | ICD-10-CM

## 2015-07-29 DIAGNOSIS — G4700 Insomnia, unspecified: Secondary | ICD-10-CM

## 2015-07-29 DIAGNOSIS — F32A Depression, unspecified: Secondary | ICD-10-CM

## 2015-07-29 DIAGNOSIS — M4120 Other idiopathic scoliosis, site unspecified: Secondary | ICD-10-CM

## 2015-07-29 DIAGNOSIS — R1013 Epigastric pain: Secondary | ICD-10-CM

## 2015-07-29 DIAGNOSIS — F411 Generalized anxiety disorder: Secondary | ICD-10-CM | POA: Diagnosis not present

## 2015-07-29 DIAGNOSIS — R109 Unspecified abdominal pain: Secondary | ICD-10-CM | POA: Insufficient documentation

## 2015-07-29 DIAGNOSIS — IMO0001 Reserved for inherently not codable concepts without codable children: Secondary | ICD-10-CM

## 2015-07-29 MED ORDER — GABAPENTIN 300 MG PO CAPS: ORAL_CAPSULE | ORAL | Status: DC

## 2015-07-29 MED ORDER — TRAZODONE HCL 50 MG PO TABS: 25.0000 mg | ORAL_TABLET | Freq: Every evening | ORAL | Status: DC | PRN

## 2015-07-29 MED ORDER — METOCLOPRAMIDE HCL 10 MG PO TABS: 5.0000 mg | ORAL_TABLET | Freq: Three times a day (TID) | ORAL | Status: DC

## 2015-07-29 MED ORDER — VILAZODONE HCL 10 & 20 MG PO KIT: 1.0000 | PACK | Freq: Every day | ORAL | Status: DC

## 2015-07-29 NOTE — Assessment & Plan Note (Addendum)
Acute. Likely due to diabetic gastroparesis given uncontrolled diabetes as well as response to Reglan. Continue Reglan. Counseled patient on side effects that warrant cessation of medication. Follow up in 2 weeks.

## 2015-07-29 NOTE — Assessment & Plan Note (Signed)
Recheck in 2 weeks 

## 2015-07-29 NOTE — Assessment & Plan Note (Signed)
Chronic, moderate per GAD7. Refer for CBT and start antidepressants. FOllow up in 2 weeks.

## 2015-07-29 NOTE — Assessment & Plan Note (Addendum)
Chronic. Likely secondary to DM. Will start gabapentin 300 tid and likely need to escalate given past failure. Scoliosis is relevant history though signs and symptoms not consistent as cause for pain given isolated diminished light touch. Follow up in 2 weeks.

## 2015-07-29 NOTE — Assessment & Plan Note (Signed)
Subacute. Likely secondary to pain as well as anxiety/depression and poor sleep hygiene. Provided information on sleep hygiene, treating other issues. Trazodone nightly in the interim, follow up in 2 weeks.

## 2015-07-29 NOTE — Assessment & Plan Note (Addendum)
Chronic, uncontrolled, moderate. Will start Vilazodone given failed medications in past. Counseled patient on delayed onset of effect. Will follow up in 2 weeks.

## 2015-07-29 NOTE — Assessment & Plan Note (Addendum)
Chronic, poorly controlled. Will check A1C and have patient follow up with endocrinologist. Likely cause of peripheral neuropathy and gastroparesis.

## 2015-07-29 NOTE — Progress Notes (Signed)
Jake Howell is a 25 y.o. male who presents to [place] [facility]: Primary Care today for establish care and multiple additional issues  1) Epigastric pain: patient has a several week history of worsening sharp epigastric pain that feels like his stomach is being squeezed. The pain is 7/10 today and lasts most of the day. He has had nearly daily vomiting with swings in appetite, currently decreased intake due to the vomiting. Patient was evaluated at [facility] ED 1.5 weeks ago and treated with Reglan and sucralfate for presumed diabetic gastroparesis. GI cocktail helped, but patient does not like the taste and is not in enough discomfort to want to try today. He was found with mild transaminitis at that time.The Reglan completely resolved his symptoms but he ran out of the medication and the symptoms returned.  Patient has a 15 year history of T1DM, currently being managed by an endocrinologist with [facility]. Patient has a recent A1C above 8 with average daily BGL of 150 with goal of 120. Patient notes he had a few years of personal problems where he was not taking care of himself, which lead to uncontrolled diabetes. He has been working with his doctor and is getting progressively better control.   2) Neuropathic pain: patient has 1 year history of bilateral foot paresthesia and toe numbness and foot throbbing, progressing to leg to mid shin. It occasionally burns and says it feels like it is radiating from the foot to the leg. He says this is occasionally a dull ache. His toes are now constantly numb.This persists for most of the day, worsening at night and is keeping him up at night due to discomfort. This feels better when he moves his feet around. He was started on gabapentin in the past but stopped taking it before dose escalation due to no relief. Patient does have a history of scoliosis that has been evaluated by XR but no  other treatment in the past.   3) Insomnia: Patient has 6 month history of insomnia with 3 months of worsening. This is severe at this point, with most nights resulting in him lying there all night with his eyes closed. He notes he drinks 1-2 energy drinks 2-3 times a week. He drinks home brewed tea at home as well. He has tried OTC sleep aids to no relief. He notes he feels like he cant turn his brain off and is worrying at night.   4) Anxiety/depression: Treated for severe depression in the past, though failed several medications including Zoloft and Wellbutrin due to side effects. He notes the anxiety component is more lately with constant worrying keeping him up at night, tremors, sweating and sensation of not being able to relax. Patient notes he has not had any recent treatment, has not done therapy in the past but is interested today.    Past Medical History  Diagnosis Date  . Diabetes mellitus   . Scoliosis   . Depression    History reviewed. No pertinent past surgical history. Social History  Substance Use Topics  . Smoking status: Current Every Day Smoker -- 0.50 packs/day for 1 years  . Smokeless tobacco: Not on file  . Alcohol Use: No   family history includes Cancer in his paternal grandmother; Crohn's disease in his father; Diabetes in his paternal grandmother; Stroke in his maternal uncle.  ROS as above Medications: Current Outpatient Prescriptions  Medication Sig Dispense Refill  . Insulin Human (INSULIN PUMP) 100 unit/ml  SOLN Inject into the skin continuous. novolog insulin    . sucralfate (CARAFATE) 1 g tablet     . gabapentin (NEURONTIN) 300 MG capsule One tab PO qHS for a week, then BID for a week, then TID. May double weekly to a max of 3,656m/day 180 capsule 3  . metoCLOPramide (REGLAN) 10 MG tablet Take 0.5 tablets (5 mg total) by mouth 3 (three) times daily before meals. 90 tablet 0  . traZODone (DESYREL) 50 MG tablet Take 0.5-1 tablets (25-50 mg total) by mouth  at bedtime as needed for sleep. 30 tablet 3  . Vilazodone HCl (VIIBRYD STARTER PACK) 10 & 20 MG KIT Take 1 tablet by mouth daily. Use as directed, 4 weeks QS 1 kit 0   No current facility-administered medications for this visit.   No Known Allergies   Exam:  BP 139/102 mmHg  Pulse 110  Ht 5' 9" (1.753 m)  Wt 116 lb (52.617 kg)  BMI 17.12 kg/m2 Gen: Thin gentleman  In NAD HEENT: EOMI,  MMM, poor dentition  Lungs: Normal work of breathing. CTABL Heart: RRR no MRG Abd: Hypoactive bowel sounds, Soft. Nondistended. Tender maximally in the epigastrum with diffuse tenderness in the bilateral lower quadrants.  Exts: Brisk capillary refill, warm and well perfused.  Diminished sensation to light touch in medial and lateral feet bilaterally as well as all 10 digits. Paresthesias reproduced with palpation of feet and leg up to the mid shin per patient.  Straight leg test negative   No results found for this or any previous visit (from the past 24 hour(s)). No results found. PHQ-9 13 GAD 7 17  Screening labs ordered Fe studies for RLS evaluation CBC CMP A1C TSH Vit D Lipid Panel  Please see individual assessment and plan sections.

## 2015-07-29 NOTE — Patient Instructions (Signed)
Thank you for coming in today. You were seen for your belly pain, foot pain, insomnia and depression/anxiety For the belly pain continue Reglan, stoop this medication immediately if you have any muscle stiffening or abnormal movements. For the foot pain we will start gabapentin, we can go up on this dose in the future so please continue taking it regularly. For the insomnia, please make the behavioral changes below. Treating the nerve and belly pain as well as anxiety and depression will help with this. Take Trazodone nightly in the meantime. For the depression and anxiety, we will refer you for cognitive behavioral therapy and start a different class of antidepressants that works for both anxiety and depression. We will order labwork. Please get these in the morning with nothing to eat or drink but water after midnight.  Please come back in 2 weeks.    Insomnia Insomnia is a sleep disorder that makes it difficult to fall asleep or to stay asleep. Insomnia can cause tiredness (fatigue), low energy, difficulty concentrating, mood swings, and poor performance at work or school.  There are three different ways to classify insomnia:  Difficulty falling asleep.  Difficulty staying asleep.  Waking up too early in the morning. Any type of insomnia can be long-term (chronic) or short-term (acute). Both are common. Short-term insomnia usually lasts for three months or less. Chronic insomnia occurs at least three times a week for longer than three months. CAUSES  Insomnia may be caused by another condition, situation, or substance, such as:  Anxiety.  Certain medicines.  Gastroesophageal reflux disease (GERD) or other gastrointestinal conditions.  Asthma or other breathing conditions.  Restless legs syndrome, sleep apnea, or other sleep disorders.  Chronic pain.  Menopause. This may include hot flashes.  Stroke.  Abuse of alcohol, tobacco, or illegal drugs.  Depression.  Caffeine.    Neurological disorders, such as Alzheimer disease.  An overactive thyroid (hyperthyroidism). The cause of insomnia may not be known. RISK FACTORS Risk factors for insomnia include:  Gender. Women are more commonly affected than men.  Age. Insomnia is more common as you get older.  Stress. This may involve your professional or personal life.  Income. Insomnia is more common in people with lower income.  Lack of exercise.   Irregular work schedule or night shifts.  Traveling between different time zones. SIGNS AND SYMPTOMS If you have insomnia, trouble falling asleep or trouble staying asleep is the main symptom. This may lead to other symptoms, such as:  Feeling fatigued.  Feeling nervous about going to sleep.  Not feeling rested in the morning.  Having trouble concentrating.  Feeling irritable, anxious, or depressed. TREATMENT  Treatment for insomnia depends on the cause. If your insomnia is caused by an underlying condition, treatment will focus on addressing the condition. Treatment may also include:   Medicines to help you sleep.  Counseling or therapy.  Lifestyle adjustments. HOME CARE INSTRUCTIONS   Take medicines only as directed by your health care provider.  Keep regular sleeping and waking hours. Avoid naps.  Keep a sleep diary to help you and your health care provider figure out what could be causing your insomnia. Include:   When you sleep.  When you wake up during the night.  How well you sleep.   How rested you feel the next day.  Any side effects of medicines you are taking.  What you eat and drink.   Make your bedroom a comfortable place where it is easy to fall asleep:  Put up shades or special blackout curtains to block light from outside.  Use a white noise machine to block noise.  Keep the temperature cool.   Exercise regularly as directed by your health care provider. Avoid exercising right before bedtime.  Use  relaxation techniques to manage stress. Ask your health care provider to suggest some techniques that may work well for you. These may include:  Breathing exercises.  Routines to release muscle tension.  Visualizing peaceful scenes.  Cut back on alcohol, caffeinated beverages, and cigarettes, especially close to bedtime. These can disrupt your sleep.  Do not overeat or eat spicy foods right before bedtime. This can lead to digestive discomfort that can make it hard for you to sleep.  Limit screen use before bedtime. This includes:  Watching TV.  Using your smartphone, tablet, and computer.  Stick to a routine. This can help you fall asleep faster. Try to do a quiet activity, brush your teeth, and go to bed at the same time each night.  Get out of bed if you are still awake after 15 minutes of trying to sleep. Keep the lights down, but try reading or doing a quiet activity. When you feel sleepy, go back to bed.  Make sure that you drive carefully. Avoid driving if you feel very sleepy.  Keep all follow-up appointments as directed by your health care provider. This is important. SEEK MEDICAL CARE IF:   You are tired throughout the day or have trouble in your daily routine due to sleepiness.  You continue to have sleep problems or your sleep problems get worse. SEEK IMMEDIATE MEDICAL CARE IF:   You have serious thoughts about hurting yourself or someone else.   This information is not intended to replace advice given to you by your health care provider. Make sure you discuss any questions you have with your health care provider.   Document Released: 06/11/2000 Document Revised: 03/05/2015 Document Reviewed: 03/15/2014 Elsevier Interactive Patient Education [employer]

## 2015-07-30 LAB — CBC
HEMATOCRIT: 47.5 % (ref 39.0–52.0)
Hemoglobin: 15.8 g/dL (ref 13.0–17.0)
MCH: 29.5 pg (ref 26.0–34.0)
MCHC: 33.3 g/dL (ref 30.0–36.0)
MCV: 88.6 fL (ref 78.0–100.0)
MPV: 9.7 fL (ref 8.6–12.4)
PLATELETS: 405 10*3/uL — AB (ref 150–400)
RBC: 5.36 MIL/uL (ref 4.22–5.81)
RDW: 13 % (ref 11.5–15.5)
WBC: 8.5 10*3/uL (ref 4.0–10.5)

## 2015-07-30 LAB — LIPID PANEL
CHOL/HDL RATIO: 3.6 ratio (ref <=5.0)
CHOLESTEROL: 114 mg/dL — AB (ref 125–200)
HDL: 32 mg/dL — AB (ref >=40)
LDL Cholesterol: 64 mg/dL (ref <130)
Triglycerides: 89 mg/dL (ref <150)
VLDL: 18 mg/dL (ref <30)

## 2015-07-30 LAB — COMPREHENSIVE METABOLIC PANEL
ALK PHOS: 67 U/L (ref 40–115)
ALT: 62 U/L — AB (ref 9–46)
AST: 38 U/L (ref 10–40)
Albumin: 4.3 g/dL (ref 3.6–5.1)
BUN: 10 mg/dL (ref 7–25)
CALCIUM: 10.1 mg/dL (ref 8.6–10.3)
CHLORIDE: 100 mmol/L (ref 98–110)
CO2: 28 mmol/L (ref 20–31)
Creat: 0.66 mg/dL (ref 0.60–1.35)
Glucose, Bld: 184 mg/dL — ABNORMAL HIGH (ref 65–99)
POTASSIUM: 4.6 mmol/L (ref 3.5–5.3)
Sodium: 139 mmol/L (ref 135–146)
TOTAL PROTEIN: 8 g/dL (ref 6.1–8.1)
Total Bilirubin: 0.9 mg/dL (ref 0.2–1.2)

## 2015-07-30 LAB — HEMOGLOBIN A1C
Hgb A1c MFr Bld: 9.8 % — ABNORMAL HIGH (ref <5.7)
Mean Plasma Glucose: 235 mg/dL — ABNORMAL HIGH (ref <117)

## 2015-07-30 LAB — VITAMIN D 25 HYDROXY (VIT D DEFICIENCY, FRACTURES): Vit D, 25-Hydroxy: 18 ng/mL — ABNORMAL LOW (ref 30–100)

## 2015-07-30 NOTE — Progress Notes (Signed)
Quick Note:  Vitamin D deficiency noted. Take 2000 units of vitamin D daily over-the-counter.  Labs also show A1c at 9.8. Continue to work with the endocrinologist on this issue.   ______

## 2015-08-07 ENCOUNTER — Encounter: Payer: Self-pay | Admitting: Family Medicine

## 2015-08-07 ENCOUNTER — Ambulatory Visit (INDEPENDENT_AMBULATORY_CARE_PROVIDER_SITE_OTHER): Payer: BLUE CROSS/BLUE SHIELD | Admitting: Family Medicine

## 2015-08-07 VITALS — BP 141/93 | HR 102 | Temp 98.0°F | Wt 112.0 lb

## 2015-08-07 DIAGNOSIS — R112 Nausea with vomiting, unspecified: Secondary | ICD-10-CM | POA: Diagnosis not present

## 2015-08-07 DIAGNOSIS — E1040 Type 1 diabetes mellitus with diabetic neuropathy, unspecified: Secondary | ICD-10-CM

## 2015-08-07 LAB — GLUCOSE, POCT (MANUAL RESULT ENTRY): POC Glucose: 161 mg/dl — AB (ref 70–99)

## 2015-08-07 NOTE — Assessment & Plan Note (Signed)
Intractable vomiting now with dehydration. Plan to refer to the emergency room for evaluation and management. Patient likely would require IV fluids and possible IV antibiotics. Recommend patient follow-up with me following ER visit if not admitted.

## 2015-08-07 NOTE — Patient Instructions (Signed)
Thank you for coming in today. Go to the Emergency Room.  Return for follow up after.

## 2015-08-07 NOTE — Progress Notes (Signed)
       Jake Howell is a 25 y.o. male who presents to [facility]: [facility] Care today for vomiting and abdominal pain. Patient notes a 2 to three-day history of vomiting and diarrhea. Pain is consistent with previous episodes of vomiting and diarrhea thought to be related to possible diabetic gastroparesis. Of note patient does have a family history of Crohn's disease. Patient was recently seen in my clinic and was given metoclopramide which has helped previously. He's been taking this medicine over the current episode has persisted despite the medicine. He notes he has difficulty keeping up current metoclopramide down. He notes he has lost weight since the last visit as well.   Past Medical History  Diagnosis Date  . Diabetes mellitus   . Scoliosis   . Depression    No past surgical history on file. Social History  Substance Use Topics  . Smoking status: Current Every Day Smoker -- 0.50 packs/day for 1 years  . Smokeless tobacco: Not on file  . Alcohol Use: No   family history includes Cancer in his paternal grandmother; Crohn's disease in his father; Diabetes in his paternal grandmother; Stroke in his maternal uncle.  ROS as above Medications: Current Outpatient Prescriptions  Medication Sig Dispense Refill  . gabapentin (NEURONTIN) 300 MG capsule One tab PO qHS for a week, then BID for a week, then TID. May double weekly to a max of 3,646m/day 180 capsule 3  . Insulin Human (INSULIN PUMP) 100 unit/ml SOLN Inject into the skin continuous. novolog insulin    . metoCLOPramide (REGLAN) 10 MG tablet Take 0.5 tablets (5 mg total) by mouth 3 (three) times daily before meals. 90 tablet 0  . sucralfate (CARAFATE) 1 g tablet     . traZODone (DESYREL) 50 MG tablet Take 0.5-1 tablets (25-50 mg total) by mouth at bedtime as needed for sleep. 30 tablet 3  . Vilazodone HCl (VIIBRYD STARTER PACK) 10 & 20 MG KIT  Take 1 tablet by mouth daily. Use as directed, 4 weeks QS 1 kit 0   No current facility-administered medications for this visit.   No Known Allergies   Exam:  BP 141/93 mmHg  Pulse 102  Temp(Src) 98 F (36.7 C) (Oral)  Wt 112 lb (50.803 kg)  SpO2 100%   Gen: Ill appearing HEENT: EOMI,  MMM Lungs: Normal work of breathing. CTABL Heart: Mild tachycardia no MRG Abd: NABS, Soft. Nondistended, diffusely tender to palpation without rebound or guarding Exts: Brisk capillary refill, warm and well perfused.   Results for orders placed or performed in visit on 08/07/15 (from the past 24 hour(s))  POCT Glucose (CBG)     Status: Abnormal   Collection Time: 08/07/15  2:24 PM  Result Value Ref Range   POC Glucose 161 (A) 70 - 99 mg/dl   No results found.   Please see individual assessment and plan sections.

## 2015-08-12 ENCOUNTER — Ambulatory Visit (INDEPENDENT_AMBULATORY_CARE_PROVIDER_SITE_OTHER): Payer: BLUE CROSS/BLUE SHIELD | Admitting: Family Medicine

## 2015-08-12 ENCOUNTER — Encounter: Payer: Self-pay | Admitting: Family Medicine

## 2015-08-12 VITALS — BP 127/91 | HR 110 | Temp 98.1°F | Wt 103.0 lb

## 2015-08-12 DIAGNOSIS — R112 Nausea with vomiting, unspecified: Secondary | ICD-10-CM | POA: Diagnosis not present

## 2015-08-12 DIAGNOSIS — R1013 Epigastric pain: Secondary | ICD-10-CM

## 2015-08-12 MED ORDER — PROMETHAZINE HCL 25 MG RE SUPP: 25.0000 mg | Freq: Four times a day (QID) | RECTAL | Status: DC | PRN

## 2015-08-12 NOTE — Progress Notes (Signed)
Jake Howell is a 25 y.o. male who presents to [facility]: [facility] today for abdominal pain.  Patient has had a multiple weke history of recurrent abdominal pain and intractable vomiting with recent trip to ER 1 week ago. He was found by abdominal CT to be severely constipated, resuscitated with fluid and IV antiemetics. Since then, he has continues to struggle with nausea and vomiting, often unable to keep down his antiemetic medication. He continues to endorse abdominal pain, 7/10 in the medial upper and lower abdomen. This is worsened with vomiting episodes and patient suspects this to be a superficial MSK pain. The nausea has improved since his last vomiting episode at 3am yesterday. He thinks he is able to to take fluids and has some appetite today. He has been taking miralax but had not had BM in previous 6 days until this morning when he had 3 small loose stools. He has not taken any medication for the abdominal pain. He sees GI tomorrow. He has lost 10 lbs in the last week.    Past Medical History  Diagnosis Date  . Diabetes mellitus   . Scoliosis   . Depression    No past surgical history on file. Social History  Substance Use Topics  . Smoking status: Current Every Day Smoker -- 0.50 packs/day for 1 years  . Smokeless tobacco: Not on file  . Alcohol Use: No   family history includes Cancer in his paternal grandmother; Crohn's disease in his father; Diabetes in his paternal grandmother; Stroke in his maternal uncle.  ROS as above Medications: Current Outpatient Prescriptions  Medication Sig Dispense Refill  . gabapentin (NEURONTIN) 300 MG capsule One tab PO qHS for a week, then BID for a week, then TID. May double weekly to a max of 3,632m/day 180 capsule 3  . Insulin Human (INSULIN PUMP) 100 unit/ml SOLN Inject into the skin continuous. novolog insulin    . metoCLOPramide (REGLAN)  10 MG tablet Take 0.5 tablets (5 mg total) by mouth 3 (three) times daily before meals. 90 tablet 0  . promethazine (PHENERGAN) 25 MG suppository Place 1 suppository (25 mg total) rectally every 6 (six) hours as needed for nausea or vomiting. 12 each 0  . sucralfate (CARAFATE) 1 g tablet     . traZODone (DESYREL) 50 MG tablet Take 0.5-1 tablets (25-50 mg total) by mouth at bedtime as needed for sleep. 30 tablet 3  . Vilazodone HCl (VIIBRYD STARTER PACK) 10 & 20 MG KIT Take 1 tablet by mouth daily. Use as directed, 4 weeks QS 1 kit 0   No current facility-administered medications for this visit.   No Known Allergies   Exam:  BP 127/91 mmHg  Pulse 110  Temp(Src) 98.1 F (36.7 C) (Oral)  Wt 103 lb (46.72 kg) Gen: Uncomfortable man in NAD HEENT: EOMI,  MMM Lungs: Normal work of breathing. CTABL Heart: Sinus tachycardia with no MRG Abd: NABS, Soft. Nondistended, Tender to the midline from the epigastrium to hypogastric region Exts: Capillary refill marginally prolonged, warm and well perfused.   IV was attempted to be placed. Patient declined further attempts.   No results found for this or any previous visit (from the past 24 hour(s)). No results found.   Please see individual assessment and plan sections.

## 2015-08-12 NOTE — Assessment & Plan Note (Signed)
Subacute. This is likely due to gastroparesis secondary to poorly controlled DM. Patient is dehydrated to exam but mildly so. Attempted to administer 1L IV fluids in office but two vessels blew. Recommended oral fluid intake and return to ER if not possible. Recommended no narcotic pain medication and prescribed Phenergan suppository for return of nausea. Follow up with GI tomorrow and with [place] in 1 week.

## 2015-08-12 NOTE — Assessment & Plan Note (Signed)
Likely multifactorial due to gastroparesis with constipation as well as MSK strain from intractable vomiting. Treat with tylenol given GI motility issues. Etiology likely non-infectious or inflammatory visceral pain as CT abdomen from 1 week ago was negative. Follow up in 1 week.

## 2015-08-12 NOTE — Patient Instructions (Signed)
Thank you for coming in today. You were seen today for your abdominal pain. Continue drinking fluids in slow small amounts. Stop tramadol which can slow your GI tratc down. Continue miralax and Reglan, use phenergan suppositories for nausea. Use tylenol for pain control.  Go to your gastroenterologist appointment tomorrow.  Return next week.

## 2015-08-19 ENCOUNTER — Telehealth: Payer: Self-pay

## 2015-08-19 DIAGNOSIS — R1013 Epigastric pain: Secondary | ICD-10-CM

## 2015-08-19 NOTE — Telephone Encounter (Signed)
Pt called stating that he was confused and that he has not been taking his rx for metoCLOPramide (REGLAN) 10 MG tablet as recommended. He state that instead of taking 0.5 tablets three time a day he has been taking 1 tablet 3 times a day and has run out. Please advise. Pt also states that his insurance requires him to have 90 day supply of medications. rx should be sent to the [facility] on [address] in [place].

## 2015-08-21 MED ORDER — METOCLOPRAMIDE HCL 10 MG PO TABS: 10.0000 mg | ORAL_TABLET | Freq: Three times a day (TID) | ORAL | Status: DC

## 2015-08-21 NOTE — Telephone Encounter (Signed)
Medicine updated to 1 pill tid.

## 2015-08-21 NOTE — Telephone Encounter (Signed)
Notified patient that medication has been called in with a 90 day supply.

## 2015-09-08 ENCOUNTER — Encounter: Payer: Self-pay | Admitting: Family Medicine

## 2015-09-08 DIAGNOSIS — K3184 Gastroparesis: Secondary | ICD-10-CM

## 2015-09-08 DIAGNOSIS — E1143 Type 2 diabetes mellitus with diabetic autonomic (poly)neuropathy: Secondary | ICD-10-CM

## 2015-09-08 HISTORY — DX: Type 2 diabetes mellitus with diabetic autonomic (poly)neuropathy: E11.43

## 2015-09-08 HISTORY — DX: Gastroparesis: K31.84

## 2015-09-09 ENCOUNTER — Encounter: Payer: Self-pay | Admitting: Family Medicine

## 2015-09-09 ENCOUNTER — Ambulatory Visit (INDEPENDENT_AMBULATORY_CARE_PROVIDER_SITE_OTHER): Payer: BLUE CROSS/BLUE SHIELD | Admitting: Family Medicine

## 2015-09-09 VITALS — BP 120/87 | HR 111 | Wt 109.0 lb

## 2015-09-09 DIAGNOSIS — E1143 Type 2 diabetes mellitus with diabetic autonomic (poly)neuropathy: Secondary | ICD-10-CM | POA: Diagnosis not present

## 2015-09-09 DIAGNOSIS — E1040 Type 1 diabetes mellitus with diabetic neuropathy, unspecified: Secondary | ICD-10-CM

## 2015-09-09 DIAGNOSIS — R1013 Epigastric pain: Secondary | ICD-10-CM | POA: Diagnosis not present

## 2015-09-09 DIAGNOSIS — K3184 Gastroparesis: Secondary | ICD-10-CM | POA: Diagnosis not present

## 2015-09-09 MED ORDER — METOCLOPRAMIDE HCL 10 MG PO TABS: 10.0000 mg | ORAL_TABLET | Freq: Three times a day (TID) | ORAL | Status: DC

## 2015-09-09 MED ORDER — PREGABALIN 75 MG PO CAPS: 75.0000 mg | ORAL_CAPSULE | Freq: Two times a day (BID) | ORAL | Status: DC

## 2015-09-09 NOTE — Assessment & Plan Note (Signed)
Peripheral neuropathy. Switch to Lyrica. Recheck in 1 month. Obtain records from endocrinology

## 2015-09-09 NOTE — Assessment & Plan Note (Addendum)
Continue Reglan. Discontinue marijuana. Refer to dietitian.  Recheck in 1 month

## 2015-09-09 NOTE — Patient Instructions (Signed)
Thank you for coming in today. Return in 1 month.  STOP gabapentin.  Start lyrica.  Start Viibryd.  Stop marijuana.

## 2015-09-09 NOTE — Progress Notes (Signed)
       Jake Howell is a 25 y.o. male who presents to [facility]: [facility] today for follow-up gastroparesis and peripheral neuropathy. Patient has type 1 diabetes that historically has been poorly controlled. He's been having abdominal pain and vomiting almost consistently for several months now. He currently is feeling reasonably well with the regimen of Reglan. He recently had an upper endoscopy by gastroenterology to confirm the diagnosis of gastroparesis. Patient is trying to figure out and eating plan and will work with both his diabetes and with his gastroparesis.  Additionally patient has bad peripheral neuropathy. He's tried gabapentin which has not helped. He would like to try Lyrica possible.   Past Medical History  Diagnosis Date  . Diabetes mellitus   . Scoliosis   . Depression    No past surgical history on file. Social History  Substance Use Topics  . Smoking status: Current Every Day Smoker -- 0.50 packs/day for 1 years  . Smokeless tobacco: Not on file  . Alcohol Use: No   family history includes Cancer in his paternal grandmother; Crohn's disease in his father; Diabetes in his paternal grandmother; Stroke in his maternal uncle.  ROS as above Medications: Current Outpatient Prescriptions  Medication Sig Dispense Refill  . Insulin Human (INSULIN PUMP) 100 unit/ml SOLN Inject into the skin continuous. novolog insulin    . metoCLOPramide (REGLAN) 10 MG tablet Take 1 tablet (10 mg total) by mouth 3 (three) times daily before meals. 270 tablet 0  . sucralfate (CARAFATE) 1 g tablet     . traZODone (DESYREL) 50 MG tablet Take 0.5-1 tablets (25-50 mg total) by mouth at bedtime as needed for sleep. 30 tablet 3  . Vilazodone HCl (VIIBRYD STARTER PACK) 10 & 20 MG KIT Take 1 tablet by mouth daily. Use as directed, 4 weeks QS 1 kit 0  . pregabalin (LYRICA) 75 MG capsule Take 1 capsule (75 mg  total) by mouth 2 (two) times daily. 60 capsule 3   No current facility-administered medications for this visit.   No Known Allergies   Exam:  BP 120/87 mmHg  Pulse 111  Wt 109 lb (49.442 kg) Gen: Well NAD HEENT: EOMI,  MMM Lungs: Normal work of breathing. CTABL Heart: RRR no MRG Heart rate 98 bpm per my check Abd: NABS, Soft. Nondistended, Nontender Exts: Brisk capillary refill, warm and well perfused.   No results found for this or any previous visit (from the past 24 hour(s)). No results found.   Please see individual assessment and plan sections.

## 2015-09-15 ENCOUNTER — Encounter: Payer: Self-pay | Admitting: Family Medicine

## 2015-11-18 ENCOUNTER — Ambulatory Visit (INDEPENDENT_AMBULATORY_CARE_PROVIDER_SITE_OTHER): Payer: BLUE CROSS/BLUE SHIELD | Admitting: Family Medicine

## 2015-11-18 ENCOUNTER — Encounter: Payer: Self-pay | Admitting: Family Medicine

## 2015-11-18 VITALS — BP 122/82 | HR 98 | Temp 98.0°F | Wt 109.0 lb

## 2015-11-18 DIAGNOSIS — M419 Scoliosis, unspecified: Secondary | ICD-10-CM | POA: Insufficient documentation

## 2015-11-18 DIAGNOSIS — R1013 Epigastric pain: Secondary | ICD-10-CM | POA: Diagnosis not present

## 2015-11-18 DIAGNOSIS — E1040 Type 1 diabetes mellitus with diabetic neuropathy, unspecified: Secondary | ICD-10-CM | POA: Diagnosis not present

## 2015-11-18 DIAGNOSIS — K3184 Gastroparesis: Secondary | ICD-10-CM

## 2015-11-18 DIAGNOSIS — E1143 Type 2 diabetes mellitus with diabetic autonomic (poly)neuropathy: Secondary | ICD-10-CM | POA: Diagnosis not present

## 2015-11-18 MED ORDER — PROMETHAZINE HCL 25 MG RE SUPP: 25.0000 mg | Freq: Four times a day (QID) | RECTAL | Status: DC | PRN

## 2015-11-18 MED ORDER — METOCLOPRAMIDE HCL 10 MG PO TABS: 10.0000 mg | ORAL_TABLET | Freq: Four times a day (QID) | ORAL | Status: DC | PRN

## 2015-11-18 NOTE — Progress Notes (Signed)
       Jake Howell is a 25 y.o. male who presents to [facility]: [facility] today for abdominal pain. Patient has a history of gastroparesis. He previously was doing reasonably well with Reglan 3 times daily. His symptoms worsened about 2 weeks ago. He said trouble eating associated with abdominal pain and vomiting. He's quite frustrated with his illness. No fevers chills.   Past Medical History  Diagnosis Date  . Diabetes mellitus   . Scoliosis   . Depression    No past surgical history on file. Social History  Substance Use Topics  . Smoking status: Current Every Day Smoker -- 0.50 packs/day for 1 years  . Smokeless tobacco: Not on file  . Alcohol Use: No   family history includes Cancer in his paternal grandmother; Crohn's disease in his father; Diabetes in his paternal grandmother; Stroke in his maternal uncle.  ROS as above:  Medications: Current Outpatient Prescriptions  Medication Sig Dispense Refill  . Insulin Human (INSULIN PUMP) 100 unit/ml SOLN Inject into the skin continuous. novolog insulin    . metoCLOPramide (REGLAN) 10 MG tablet Take 1 tablet (10 mg total) by mouth every 6 (six) hours as needed for nausea. 360 tablet 1  . polyethylene glycol powder (MIRALAX) powder     . pregabalin (LYRICA) 75 MG capsule Take 1 capsule (75 mg total) by mouth 2 (two) times daily. 60 capsule 3  . traZODone (DESYREL) 50 MG tablet Take 0.5-1 tablets (25-50 mg total) by mouth at bedtime as needed for sleep. 30 tablet 3  . Vilazodone HCl (VIIBRYD STARTER PACK) 10 & 20 MG KIT Take 1 tablet by mouth daily. Use as directed, 4 weeks QS 1 kit 0  . promethazine (PHENERGAN) 25 MG suppository Place 1 suppository (25 mg total) rectally every 6 (six) hours as needed for nausea or vomiting. 12 each 1   No current facility-administered medications for this visit.   No Known Allergies   Exam:    BP 122/82 mmHg  Pulse 98  Temp(Src) 98 F (36.7 C) (Oral)  Wt 109 lb (49.442 kg) Gen: Well NAD, non-toxic appearing HEENT: EOMI,  MMM Lungs: Normal work of breathing. CTABL Heart: RRR no MRG Abd: NABS, Soft. Nondistended, Nontender Exts: Brisk capillary refill, warm and well perfused.   No results found for this or any previous visit (from the past 24 hour(s)). No results found.    Assessment and Plan: 25 y.o. male with dramatic gastroparesis. Check basic labs. Increase Reglan to 4 times daily. Use Phenergan suppositories as needed. Refer to functional GI clinic at [facility]. Return as needed.  Discussed warning signs or symptoms. Please see discharge instructions. Patient expresses understanding.

## 2015-11-18 NOTE — Patient Instructions (Signed)
Thank you for coming in today. If your belly pain worsens, or you have high fever, bad vomiting, blood in your stool or black tarry stool go to the Emergency Room.   Use rectal phenergan as needed.  Increase reglan to 4x daily.  [facility].   Get labs today.

## 2015-11-19 LAB — COMPREHENSIVE METABOLIC PANEL
ALBUMIN: 4.3 g/dL (ref 3.6–5.1)
ALK PHOS: 72 U/L (ref 40–115)
ALT: 128 U/L — ABNORMAL HIGH (ref 9–46)
AST: 59 U/L — ABNORMAL HIGH (ref 10–40)
BILIRUBIN TOTAL: 0.6 mg/dL (ref 0.2–1.2)
BUN: 5 mg/dL — ABNORMAL LOW (ref 7–25)
CALCIUM: 9.5 mg/dL (ref 8.6–10.3)
CO2: 30 mmol/L (ref 20–31)
Chloride: 97 mmol/L — ABNORMAL LOW (ref 98–110)
Creat: 0.66 mg/dL (ref 0.60–1.35)
Glucose, Bld: 143 mg/dL — ABNORMAL HIGH (ref 65–99)
Potassium: 4.1 mmol/L (ref 3.5–5.3)
SODIUM: 138 mmol/L (ref 135–146)
TOTAL PROTEIN: 7.1 g/dL (ref 6.1–8.1)

## 2015-11-19 LAB — CBC
HEMATOCRIT: 44.1 % (ref 38.5–50.0)
HEMOGLOBIN: 15 g/dL (ref 13.2–17.1)
MCH: 29.2 pg (ref 27.0–33.0)
MCHC: 34 g/dL (ref 32.0–36.0)
MCV: 86 fL (ref 80.0–100.0)
MPV: 10.1 fL (ref 7.5–12.5)
Platelets: 302 10*3/uL (ref 140–400)
RBC: 5.13 MIL/uL (ref 4.20–5.80)
RDW: 13.1 % (ref 11.0–15.0)
WBC: 6.6 10*3/uL (ref 3.8–10.8)

## 2015-11-19 LAB — HEMOGLOBIN A1C
HEMOGLOBIN A1C: 9.1 % — AB (ref <5.7)
MEAN PLASMA GLUCOSE: 214 mg/dL

## 2015-11-19 LAB — LIPASE: LIPASE: 12 U/L (ref 7–60)

## 2015-11-21 NOTE — Progress Notes (Signed)
Quick Note:  Labs look ok. We will want to recheck soon. ______

## 2015-12-15 ENCOUNTER — Telehealth: Payer: Self-pay | Admitting: *Deleted

## 2015-12-15 NOTE — Telephone Encounter (Signed)
Patient called stating he needs a note to take to the Social Security office stating that he cannot work because of the gastroparesis. He states he has applied to get medicaid and disability but until that comes through he needs a note from his doctor stating that he cannot work. Please advise

## 2015-12-16 ENCOUNTER — Encounter: Payer: Self-pay | Admitting: Family Medicine

## 2015-12-16 NOTE — Telephone Encounter (Signed)
Pt notified and letter up front 

## 2015-12-16 NOTE — Telephone Encounter (Signed)
Letter written

## 2016-02-17 DIAGNOSIS — L0231 Cutaneous abscess of buttock: Secondary | ICD-10-CM | POA: Diagnosis not present

## 2016-02-17 DIAGNOSIS — E1042 Type 1 diabetes mellitus with diabetic polyneuropathy: Secondary | ICD-10-CM | POA: Diagnosis not present

## 2016-02-17 DIAGNOSIS — Z7189 Other specified counseling: Secondary | ICD-10-CM | POA: Diagnosis not present

## 2016-02-17 DIAGNOSIS — E1143 Type 2 diabetes mellitus with diabetic autonomic (poly)neuropathy: Secondary | ICD-10-CM | POA: Diagnosis not present

## 2016-02-27 DIAGNOSIS — L0231 Cutaneous abscess of buttock: Secondary | ICD-10-CM | POA: Diagnosis not present

## 2016-03-09 DIAGNOSIS — L0231 Cutaneous abscess of buttock: Secondary | ICD-10-CM | POA: Diagnosis not present

## 2016-03-15 DIAGNOSIS — L0231 Cutaneous abscess of buttock: Secondary | ICD-10-CM | POA: Diagnosis not present

## 2016-03-25 DIAGNOSIS — K3184 Gastroparesis: Secondary | ICD-10-CM | POA: Diagnosis not present

## 2016-03-25 DIAGNOSIS — E0800 Diabetes mellitus due to underlying condition with hyperosmolarity without nonketotic hyperglycemic-hyperosmolar coma (NKHHC): Secondary | ICD-10-CM | POA: Diagnosis not present

## 2016-03-31 DIAGNOSIS — L0231 Cutaneous abscess of buttock: Secondary | ICD-10-CM | POA: Diagnosis not present

## 2016-04-10 ENCOUNTER — Other Ambulatory Visit: Payer: Self-pay | Admitting: Family Medicine

## 2016-04-10 DIAGNOSIS — R1013 Epigastric pain: Secondary | ICD-10-CM

## 2016-04-13 NOTE — Telephone Encounter (Signed)
Are refills appropriate. Please refill if so.

## 2016-05-13 DIAGNOSIS — E785 Hyperlipidemia, unspecified: Secondary | ICD-10-CM | POA: Diagnosis not present

## 2016-05-13 DIAGNOSIS — Z9119 Patient's noncompliance with other medical treatment and regimen: Secondary | ICD-10-CM | POA: Diagnosis not present

## 2016-05-13 DIAGNOSIS — Z9641 Presence of insulin pump (external) (internal): Secondary | ICD-10-CM | POA: Diagnosis not present

## 2016-05-13 DIAGNOSIS — Z794 Long term (current) use of insulin: Secondary | ICD-10-CM | POA: Diagnosis not present

## 2016-05-13 DIAGNOSIS — E108 Type 1 diabetes mellitus with unspecified complications: Secondary | ICD-10-CM | POA: Diagnosis not present

## 2016-05-13 DIAGNOSIS — E1065 Type 1 diabetes mellitus with hyperglycemia: Secondary | ICD-10-CM | POA: Diagnosis not present

## 2016-05-13 DIAGNOSIS — L0231 Cutaneous abscess of buttock: Secondary | ICD-10-CM | POA: Diagnosis not present

## 2016-05-13 DIAGNOSIS — I10 Essential (primary) hypertension: Secondary | ICD-10-CM | POA: Diagnosis not present

## 2016-07-22 DIAGNOSIS — K5900 Constipation, unspecified: Secondary | ICD-10-CM | POA: Diagnosis not present

## 2016-07-22 DIAGNOSIS — I878 Other specified disorders of veins: Secondary | ICD-10-CM | POA: Diagnosis not present

## 2016-07-22 DIAGNOSIS — R112 Nausea with vomiting, unspecified: Secondary | ICD-10-CM | POA: Diagnosis not present

## 2016-07-22 DIAGNOSIS — R1012 Left upper quadrant pain: Secondary | ICD-10-CM | POA: Diagnosis not present

## 2016-07-22 DIAGNOSIS — E109 Type 1 diabetes mellitus without complications: Secondary | ICD-10-CM | POA: Diagnosis not present

## 2016-08-12 DIAGNOSIS — Z794 Long term (current) use of insulin: Secondary | ICD-10-CM | POA: Diagnosis not present

## 2016-08-12 DIAGNOSIS — E109 Type 1 diabetes mellitus without complications: Secondary | ICD-10-CM | POA: Diagnosis not present

## 2016-10-08 DIAGNOSIS — K3184 Gastroparesis: Secondary | ICD-10-CM | POA: Diagnosis not present

## 2016-10-08 DIAGNOSIS — E1110 Type 2 diabetes mellitus with ketoacidosis without coma: Secondary | ICD-10-CM | POA: Diagnosis not present

## 2016-10-08 DIAGNOSIS — K209 Esophagitis, unspecified: Secondary | ICD-10-CM | POA: Diagnosis not present

## 2016-10-08 DIAGNOSIS — Z79899 Other long term (current) drug therapy: Secondary | ICD-10-CM | POA: Diagnosis not present

## 2016-10-08 DIAGNOSIS — K7689 Other specified diseases of liver: Secondary | ICD-10-CM | POA: Diagnosis not present

## 2016-10-08 DIAGNOSIS — Z794 Long term (current) use of insulin: Secondary | ICD-10-CM | POA: Diagnosis not present

## 2016-10-08 DIAGNOSIS — T85614A Breakdown (mechanical) of insulin pump, initial encounter: Secondary | ICD-10-CM | POA: Diagnosis not present

## 2016-10-08 DIAGNOSIS — F1490 Cocaine use, unspecified, uncomplicated: Secondary | ICD-10-CM | POA: Diagnosis not present

## 2016-10-08 DIAGNOSIS — R1011 Right upper quadrant pain: Secondary | ICD-10-CM | POA: Diagnosis not present

## 2016-10-08 DIAGNOSIS — Z87891 Personal history of nicotine dependence: Secondary | ICD-10-CM | POA: Diagnosis not present

## 2016-10-08 DIAGNOSIS — K228 Other specified diseases of esophagus: Secondary | ICD-10-CM | POA: Diagnosis not present

## 2016-10-08 DIAGNOSIS — E1010 Type 1 diabetes mellitus with ketoacidosis without coma: Secondary | ICD-10-CM | POA: Diagnosis not present

## 2016-10-08 DIAGNOSIS — R000 Tachycardia, unspecified: Secondary | ICD-10-CM | POA: Diagnosis not present

## 2016-10-08 DIAGNOSIS — K208 Other esophagitis: Secondary | ICD-10-CM | POA: Diagnosis not present

## 2016-10-08 DIAGNOSIS — J208 Acute bronchitis due to other specified organisms: Secondary | ICD-10-CM | POA: Diagnosis not present

## 2016-10-08 DIAGNOSIS — F329 Major depressive disorder, single episode, unspecified: Secondary | ICD-10-CM | POA: Diagnosis not present

## 2016-10-08 DIAGNOSIS — R109 Unspecified abdominal pain: Secondary | ICD-10-CM | POA: Diagnosis not present

## 2016-10-08 DIAGNOSIS — R1013 Epigastric pain: Secondary | ICD-10-CM | POA: Diagnosis not present

## 2016-10-08 DIAGNOSIS — R110 Nausea: Secondary | ICD-10-CM | POA: Diagnosis not present

## 2016-10-08 DIAGNOSIS — R197 Diarrhea, unspecified: Secondary | ICD-10-CM | POA: Diagnosis not present

## 2016-10-08 DIAGNOSIS — R112 Nausea with vomiting, unspecified: Secondary | ICD-10-CM | POA: Diagnosis not present

## 2016-10-08 DIAGNOSIS — E1043 Type 1 diabetes mellitus with diabetic autonomic (poly)neuropathy: Secondary | ICD-10-CM | POA: Diagnosis not present

## 2016-10-08 DIAGNOSIS — E1049 Type 1 diabetes mellitus with other diabetic neurological complication: Secondary | ICD-10-CM | POA: Diagnosis not present

## 2016-10-08 DIAGNOSIS — F1290 Cannabis use, unspecified, uncomplicated: Secondary | ICD-10-CM | POA: Diagnosis not present

## 2016-10-08 DIAGNOSIS — Z681 Body mass index (BMI) 19 or less, adult: Secondary | ICD-10-CM | POA: Diagnosis not present

## 2016-10-08 DIAGNOSIS — E1042 Type 1 diabetes mellitus with diabetic polyneuropathy: Secondary | ICD-10-CM | POA: Diagnosis not present

## 2016-10-08 DIAGNOSIS — E440 Moderate protein-calorie malnutrition: Secondary | ICD-10-CM | POA: Diagnosis not present

## 2016-10-08 DIAGNOSIS — R1110 Vomiting, unspecified: Secondary | ICD-10-CM | POA: Diagnosis not present

## 2016-10-09 DIAGNOSIS — E1010 Type 1 diabetes mellitus with ketoacidosis without coma: Secondary | ICD-10-CM | POA: Diagnosis not present

## 2016-10-09 DIAGNOSIS — R1011 Right upper quadrant pain: Secondary | ICD-10-CM | POA: Diagnosis not present

## 2016-10-09 DIAGNOSIS — R197 Diarrhea, unspecified: Secondary | ICD-10-CM | POA: Diagnosis not present

## 2016-10-09 DIAGNOSIS — E1042 Type 1 diabetes mellitus with diabetic polyneuropathy: Secondary | ICD-10-CM | POA: Diagnosis not present

## 2016-10-10 DIAGNOSIS — E1010 Type 1 diabetes mellitus with ketoacidosis without coma: Secondary | ICD-10-CM | POA: Diagnosis not present

## 2016-10-10 DIAGNOSIS — E1042 Type 1 diabetes mellitus with diabetic polyneuropathy: Secondary | ICD-10-CM | POA: Diagnosis not present

## 2016-10-10 DIAGNOSIS — R197 Diarrhea, unspecified: Secondary | ICD-10-CM | POA: Diagnosis not present

## 2016-10-10 DIAGNOSIS — R109 Unspecified abdominal pain: Secondary | ICD-10-CM | POA: Diagnosis not present

## 2016-10-11 DIAGNOSIS — E1010 Type 1 diabetes mellitus with ketoacidosis without coma: Secondary | ICD-10-CM | POA: Diagnosis not present

## 2016-10-11 DIAGNOSIS — R197 Diarrhea, unspecified: Secondary | ICD-10-CM | POA: Diagnosis not present

## 2016-10-11 DIAGNOSIS — E1042 Type 1 diabetes mellitus with diabetic polyneuropathy: Secondary | ICD-10-CM | POA: Diagnosis not present

## 2016-10-12 DIAGNOSIS — R197 Diarrhea, unspecified: Secondary | ICD-10-CM | POA: Diagnosis not present

## 2016-10-12 DIAGNOSIS — K209 Esophagitis, unspecified: Secondary | ICD-10-CM | POA: Diagnosis not present

## 2016-10-12 DIAGNOSIS — K208 Other esophagitis: Secondary | ICD-10-CM | POA: Diagnosis not present

## 2016-10-12 DIAGNOSIS — R112 Nausea with vomiting, unspecified: Secondary | ICD-10-CM | POA: Diagnosis not present

## 2016-10-12 DIAGNOSIS — E440 Moderate protein-calorie malnutrition: Secondary | ICD-10-CM | POA: Diagnosis not present

## 2016-10-12 DIAGNOSIS — E1042 Type 1 diabetes mellitus with diabetic polyneuropathy: Secondary | ICD-10-CM | POA: Diagnosis not present

## 2016-10-12 DIAGNOSIS — R1013 Epigastric pain: Secondary | ICD-10-CM | POA: Diagnosis not present

## 2016-10-12 DIAGNOSIS — E1010 Type 1 diabetes mellitus with ketoacidosis without coma: Secondary | ICD-10-CM | POA: Diagnosis not present

## 2016-10-13 DIAGNOSIS — E440 Moderate protein-calorie malnutrition: Secondary | ICD-10-CM | POA: Diagnosis not present

## 2016-10-13 DIAGNOSIS — E1010 Type 1 diabetes mellitus with ketoacidosis without coma: Secondary | ICD-10-CM | POA: Diagnosis not present

## 2016-10-13 DIAGNOSIS — E1042 Type 1 diabetes mellitus with diabetic polyneuropathy: Secondary | ICD-10-CM | POA: Diagnosis not present

## 2016-10-13 DIAGNOSIS — R197 Diarrhea, unspecified: Secondary | ICD-10-CM | POA: Diagnosis not present

## 2016-10-21 DIAGNOSIS — E1143 Type 2 diabetes mellitus with diabetic autonomic (poly)neuropathy: Secondary | ICD-10-CM | POA: Diagnosis not present

## 2016-10-21 DIAGNOSIS — Z9114 Patient's other noncompliance with medication regimen: Secondary | ICD-10-CM | POA: Diagnosis not present

## 2016-10-21 DIAGNOSIS — F329 Major depressive disorder, single episode, unspecified: Secondary | ICD-10-CM | POA: Diagnosis not present

## 2016-10-21 DIAGNOSIS — R110 Nausea: Secondary | ICD-10-CM | POA: Diagnosis not present

## 2016-10-21 DIAGNOSIS — B360 Pityriasis versicolor: Secondary | ICD-10-CM | POA: Diagnosis not present

## 2016-10-21 DIAGNOSIS — E1043 Type 1 diabetes mellitus with diabetic autonomic (poly)neuropathy: Secondary | ICD-10-CM | POA: Diagnosis not present

## 2016-10-21 DIAGNOSIS — M549 Dorsalgia, unspecified: Secondary | ICD-10-CM | POA: Diagnosis not present

## 2016-10-21 DIAGNOSIS — Z9641 Presence of insulin pump (external) (internal): Secondary | ICD-10-CM | POA: Diagnosis not present

## 2016-10-21 DIAGNOSIS — R1084 Generalized abdominal pain: Secondary | ICD-10-CM | POA: Diagnosis not present

## 2016-10-21 DIAGNOSIS — Z794 Long term (current) use of insulin: Secondary | ICD-10-CM | POA: Diagnosis not present

## 2016-10-21 DIAGNOSIS — R1110 Vomiting, unspecified: Secondary | ICD-10-CM | POA: Diagnosis not present

## 2016-10-21 DIAGNOSIS — Z87891 Personal history of nicotine dependence: Secondary | ICD-10-CM | POA: Diagnosis not present

## 2016-10-21 DIAGNOSIS — M419 Scoliosis, unspecified: Secondary | ICD-10-CM | POA: Diagnosis not present

## 2016-10-21 DIAGNOSIS — E1010 Type 1 diabetes mellitus with ketoacidosis without coma: Secondary | ICD-10-CM | POA: Diagnosis not present

## 2016-10-21 DIAGNOSIS — E43 Unspecified severe protein-calorie malnutrition: Secondary | ICD-10-CM | POA: Diagnosis not present

## 2016-10-21 DIAGNOSIS — E1049 Type 1 diabetes mellitus with other diabetic neurological complication: Secondary | ICD-10-CM | POA: Diagnosis not present

## 2016-10-21 DIAGNOSIS — Z9119 Patient's noncompliance with other medical treatment and regimen: Secondary | ICD-10-CM | POA: Diagnosis not present

## 2016-10-21 DIAGNOSIS — K3184 Gastroparesis: Secondary | ICD-10-CM | POA: Diagnosis not present

## 2016-10-21 DIAGNOSIS — K2210 Ulcer of esophagus without bleeding: Secondary | ICD-10-CM | POA: Diagnosis not present

## 2016-10-22 DIAGNOSIS — E1010 Type 1 diabetes mellitus with ketoacidosis without coma: Secondary | ICD-10-CM | POA: Diagnosis not present

## 2016-10-22 DIAGNOSIS — K2210 Ulcer of esophagus without bleeding: Secondary | ICD-10-CM | POA: Diagnosis not present

## 2016-10-22 DIAGNOSIS — E43 Unspecified severe protein-calorie malnutrition: Secondary | ICD-10-CM | POA: Diagnosis not present

## 2016-10-22 DIAGNOSIS — E1143 Type 2 diabetes mellitus with diabetic autonomic (poly)neuropathy: Secondary | ICD-10-CM | POA: Diagnosis not present

## 2016-10-23 DIAGNOSIS — K2210 Ulcer of esophagus without bleeding: Secondary | ICD-10-CM | POA: Diagnosis not present

## 2016-10-23 DIAGNOSIS — E43 Unspecified severe protein-calorie malnutrition: Secondary | ICD-10-CM | POA: Diagnosis not present

## 2016-10-23 DIAGNOSIS — E1143 Type 2 diabetes mellitus with diabetic autonomic (poly)neuropathy: Secondary | ICD-10-CM | POA: Diagnosis not present

## 2016-10-23 DIAGNOSIS — E1010 Type 1 diabetes mellitus with ketoacidosis without coma: Secondary | ICD-10-CM | POA: Diagnosis not present

## 2017-05-05 DIAGNOSIS — Z87891 Personal history of nicotine dependence: Secondary | ICD-10-CM | POA: Diagnosis not present

## 2017-05-05 DIAGNOSIS — R739 Hyperglycemia, unspecified: Secondary | ICD-10-CM | POA: Diagnosis not present

## 2017-05-05 DIAGNOSIS — E162 Hypoglycemia, unspecified: Secondary | ICD-10-CM | POA: Diagnosis not present

## 2017-05-05 DIAGNOSIS — E10649 Type 1 diabetes mellitus with hypoglycemia without coma: Secondary | ICD-10-CM | POA: Diagnosis not present

## 2017-05-05 DIAGNOSIS — Z79899 Other long term (current) drug therapy: Secondary | ICD-10-CM | POA: Diagnosis not present

## 2017-05-05 DIAGNOSIS — N3090 Cystitis, unspecified without hematuria: Secondary | ICD-10-CM | POA: Diagnosis not present

## 2017-05-05 DIAGNOSIS — R112 Nausea with vomiting, unspecified: Secondary | ICD-10-CM | POA: Diagnosis not present

## 2017-05-05 MED ORDER — GENERIC EXTERNAL MEDICATION
Status: DC
Start: ? — End: 2017-05-05

## 2017-06-30 DIAGNOSIS — E108 Type 1 diabetes mellitus with unspecified complications: Secondary | ICD-10-CM | POA: Diagnosis not present

## 2017-06-30 DIAGNOSIS — B354 Tinea corporis: Secondary | ICD-10-CM | POA: Diagnosis not present

## 2017-06-30 DIAGNOSIS — F329 Major depressive disorder, single episode, unspecified: Secondary | ICD-10-CM | POA: Diagnosis not present

## 2017-06-30 DIAGNOSIS — E1065 Type 1 diabetes mellitus with hyperglycemia: Secondary | ICD-10-CM | POA: Diagnosis not present

## 2017-06-30 DIAGNOSIS — E109 Type 1 diabetes mellitus without complications: Secondary | ICD-10-CM | POA: Diagnosis not present

## 2017-06-30 DIAGNOSIS — K3184 Gastroparesis: Secondary | ICD-10-CM | POA: Diagnosis not present

## 2017-06-30 DIAGNOSIS — I10 Essential (primary) hypertension: Secondary | ICD-10-CM | POA: Diagnosis not present

## 2017-06-30 DIAGNOSIS — M545 Low back pain: Secondary | ICD-10-CM | POA: Diagnosis not present

## 2017-06-30 DIAGNOSIS — F418 Other specified anxiety disorders: Secondary | ICD-10-CM | POA: Diagnosis not present

## 2017-06-30 DIAGNOSIS — Z794 Long term (current) use of insulin: Secondary | ICD-10-CM | POA: Diagnosis not present

## 2017-06-30 DIAGNOSIS — Z79899 Other long term (current) drug therapy: Secondary | ICD-10-CM | POA: Diagnosis not present

## 2017-06-30 DIAGNOSIS — F419 Anxiety disorder, unspecified: Secondary | ICD-10-CM | POA: Diagnosis not present

## 2017-06-30 DIAGNOSIS — Z9641 Presence of insulin pump (external) (internal): Secondary | ICD-10-CM | POA: Diagnosis not present

## 2017-10-12 ENCOUNTER — Ambulatory Visit: Payer: BLUE CROSS/BLUE SHIELD | Admitting: Physician Assistant

## 2017-10-12 DIAGNOSIS — Z0189 Encounter for other specified special examinations: Secondary | ICD-10-CM

## 2017-10-30 DIAGNOSIS — E1049 Type 1 diabetes mellitus with other diabetic neurological complication: Secondary | ICD-10-CM | POA: Diagnosis not present

## 2017-10-30 DIAGNOSIS — Z681 Body mass index (BMI) 19 or less, adult: Secondary | ICD-10-CM | POA: Diagnosis not present

## 2017-10-30 DIAGNOSIS — R9341 Abnormal radiologic findings on diagnostic imaging of renal pelvis, ureter, or bladder: Secondary | ICD-10-CM | POA: Diagnosis not present

## 2017-10-30 DIAGNOSIS — F331 Major depressive disorder, recurrent, moderate: Secondary | ICD-10-CM | POA: Diagnosis not present

## 2017-10-30 DIAGNOSIS — R109 Unspecified abdominal pain: Secondary | ICD-10-CM | POA: Diagnosis not present

## 2017-10-30 DIAGNOSIS — E162 Hypoglycemia, unspecified: Secondary | ICD-10-CM | POA: Diagnosis not present

## 2017-10-30 DIAGNOSIS — E10649 Type 1 diabetes mellitus with hypoglycemia without coma: Secondary | ICD-10-CM | POA: Diagnosis not present

## 2017-10-30 DIAGNOSIS — M419 Scoliosis, unspecified: Secondary | ICD-10-CM | POA: Diagnosis not present

## 2017-10-30 DIAGNOSIS — E1043 Type 1 diabetes mellitus with diabetic autonomic (poly)neuropathy: Secondary | ICD-10-CM | POA: Diagnosis not present

## 2017-10-30 DIAGNOSIS — E871 Hypo-osmolality and hyponatremia: Secondary | ICD-10-CM | POA: Diagnosis not present

## 2017-10-30 DIAGNOSIS — K3189 Other diseases of stomach and duodenum: Secondary | ICD-10-CM | POA: Diagnosis not present

## 2017-10-30 DIAGNOSIS — Z87891 Personal history of nicotine dependence: Secondary | ICD-10-CM | POA: Diagnosis not present

## 2017-10-30 DIAGNOSIS — E876 Hypokalemia: Secondary | ICD-10-CM | POA: Diagnosis not present

## 2017-10-30 DIAGNOSIS — B9561 Methicillin susceptible Staphylococcus aureus infection as the cause of diseases classified elsewhere: Secondary | ICD-10-CM | POA: Diagnosis not present

## 2017-10-30 DIAGNOSIS — K3184 Gastroparesis: Secondary | ICD-10-CM | POA: Diagnosis not present

## 2017-10-30 DIAGNOSIS — E1042 Type 1 diabetes mellitus with diabetic polyneuropathy: Secondary | ICD-10-CM | POA: Diagnosis not present

## 2017-10-30 DIAGNOSIS — Z9114 Patient's other noncompliance with medication regimen: Secondary | ICD-10-CM | POA: Diagnosis not present

## 2017-10-30 DIAGNOSIS — F1220 Cannabis dependence, uncomplicated: Secondary | ICD-10-CM | POA: Diagnosis not present

## 2017-10-30 DIAGNOSIS — E440 Moderate protein-calorie malnutrition: Secondary | ICD-10-CM | POA: Diagnosis not present

## 2017-10-30 DIAGNOSIS — E1143 Type 2 diabetes mellitus with diabetic autonomic (poly)neuropathy: Secondary | ICD-10-CM | POA: Diagnosis not present

## 2017-10-30 DIAGNOSIS — R531 Weakness: Secondary | ICD-10-CM | POA: Diagnosis not present

## 2017-10-30 DIAGNOSIS — N3289 Other specified disorders of bladder: Secondary | ICD-10-CM | POA: Diagnosis not present

## 2017-10-30 DIAGNOSIS — N390 Urinary tract infection, site not specified: Secondary | ICD-10-CM | POA: Diagnosis not present

## 2017-10-31 DIAGNOSIS — E871 Hypo-osmolality and hyponatremia: Secondary | ICD-10-CM | POA: Diagnosis not present

## 2017-10-31 DIAGNOSIS — E162 Hypoglycemia, unspecified: Secondary | ICD-10-CM | POA: Diagnosis not present

## 2017-10-31 DIAGNOSIS — E876 Hypokalemia: Secondary | ICD-10-CM | POA: Diagnosis not present

## 2017-10-31 DIAGNOSIS — E1143 Type 2 diabetes mellitus with diabetic autonomic (poly)neuropathy: Secondary | ICD-10-CM | POA: Diagnosis not present

## 2017-11-01 DIAGNOSIS — R109 Unspecified abdominal pain: Secondary | ICD-10-CM | POA: Diagnosis not present

## 2017-11-01 DIAGNOSIS — E1143 Type 2 diabetes mellitus with diabetic autonomic (poly)neuropathy: Secondary | ICD-10-CM | POA: Diagnosis not present

## 2017-11-01 DIAGNOSIS — E876 Hypokalemia: Secondary | ICD-10-CM | POA: Diagnosis not present

## 2017-11-01 DIAGNOSIS — E162 Hypoglycemia, unspecified: Secondary | ICD-10-CM | POA: Diagnosis not present

## 2017-11-01 DIAGNOSIS — E871 Hypo-osmolality and hyponatremia: Secondary | ICD-10-CM | POA: Diagnosis not present

## 2017-11-02 DIAGNOSIS — E1042 Type 1 diabetes mellitus with diabetic polyneuropathy: Secondary | ICD-10-CM | POA: Diagnosis not present

## 2017-11-02 DIAGNOSIS — K3184 Gastroparesis: Secondary | ICD-10-CM | POA: Diagnosis not present

## 2017-11-02 DIAGNOSIS — E871 Hypo-osmolality and hyponatremia: Secondary | ICD-10-CM | POA: Diagnosis not present

## 2017-11-02 DIAGNOSIS — E876 Hypokalemia: Secondary | ICD-10-CM | POA: Diagnosis not present

## 2017-11-02 DIAGNOSIS — F331 Major depressive disorder, recurrent, moderate: Secondary | ICD-10-CM | POA: Diagnosis not present

## 2017-11-02 DIAGNOSIS — E1143 Type 2 diabetes mellitus with diabetic autonomic (poly)neuropathy: Secondary | ICD-10-CM | POA: Diagnosis not present

## 2017-11-02 DIAGNOSIS — E162 Hypoglycemia, unspecified: Secondary | ICD-10-CM | POA: Diagnosis not present

## 2017-11-03 DIAGNOSIS — F331 Major depressive disorder, recurrent, moderate: Secondary | ICD-10-CM | POA: Diagnosis not present

## 2017-11-03 DIAGNOSIS — E871 Hypo-osmolality and hyponatremia: Secondary | ICD-10-CM | POA: Diagnosis not present

## 2017-11-03 DIAGNOSIS — E876 Hypokalemia: Secondary | ICD-10-CM | POA: Diagnosis not present

## 2017-11-03 DIAGNOSIS — E162 Hypoglycemia, unspecified: Secondary | ICD-10-CM | POA: Diagnosis not present

## 2017-11-03 DIAGNOSIS — E1143 Type 2 diabetes mellitus with diabetic autonomic (poly)neuropathy: Secondary | ICD-10-CM | POA: Diagnosis not present

## 2017-11-03 DIAGNOSIS — F1220 Cannabis dependence, uncomplicated: Secondary | ICD-10-CM | POA: Diagnosis not present

## 2017-11-03 LAB — BASIC METABOLIC PANEL
BUN: 7 (ref 4–21)
CREATININE: 0.5 — AB (ref 0.6–1.3)
Glucose: 308
POTASSIUM: 4 (ref 3.4–5.3)
Sodium: 138 (ref 137–147)

## 2017-11-05 MED ORDER — ALUM & MAG HYDROXIDE-SIMETH 200-200-20 MG/5ML PO SUSP
30.00 | ORAL | Status: DC
Start: ? — End: 2017-11-05

## 2017-11-05 MED ORDER — GENERIC EXTERNAL MEDICATION
Status: DC
Start: ? — End: 2017-11-05

## 2017-11-05 MED ORDER — ENOXAPARIN SODIUM 40 MG/0.4ML ~~LOC~~ SOLN
40.00 | SUBCUTANEOUS | Status: DC
Start: 2017-11-04 — End: 2017-11-05

## 2017-11-05 MED ORDER — ACETAMINOPHEN 325 MG PO TABS
650.00 | ORAL_TABLET | ORAL | Status: DC
Start: ? — End: 2017-11-05

## 2017-11-05 MED ORDER — FAMOTIDINE 20 MG PO TABS
20.00 | ORAL_TABLET | ORAL | Status: DC
Start: 2017-11-03 — End: 2017-11-05

## 2017-11-05 MED ORDER — TRAMADOL HCL 50 MG PO TABS
50.00 | ORAL_TABLET | ORAL | Status: DC
Start: ? — End: 2017-11-05

## 2017-11-05 MED ORDER — SENNA-DOCUSATE SODIUM 8.6-50 MG PO TABS
1.00 | ORAL_TABLET | ORAL | Status: DC
Start: ? — End: 2017-11-05

## 2017-11-05 MED ORDER — POLYETHYLENE GLYCOL 3350 17 G PO PACK
17.00 | PACK | ORAL | Status: DC
Start: 2017-11-04 — End: 2017-11-05

## 2017-11-05 MED ORDER — INSULIN LISPRO 100 UNIT/ML ~~LOC~~ SOLN
1.00 | SUBCUTANEOUS | Status: DC
Start: ? — End: 2017-11-05

## 2017-11-05 MED ORDER — VITAMIN D3 25 MCG (1000 UNIT) PO TABS
2000.00 | ORAL_TABLET | ORAL | Status: DC
Start: 2017-11-04 — End: 2017-11-05

## 2017-11-05 MED ORDER — INSULIN GLARGINE 100 UNIT/ML ~~LOC~~ SOLN
1.00 | SUBCUTANEOUS | Status: DC
Start: 2017-11-03 — End: 2017-11-05

## 2017-11-05 MED ORDER — SODIUM CHLORIDE 0.9 % IV SOLN
10.00 | INTRAVENOUS | Status: DC
Start: ? — End: 2017-11-05

## 2017-11-05 MED ORDER — KETOROLAC TROMETHAMINE 15 MG/ML IJ SOLN
15.00 | INTRAMUSCULAR | Status: DC
Start: ? — End: 2017-11-05

## 2017-11-05 MED ORDER — NITROGLYCERIN 0.4 MG SL SUBL
0.40 | SUBLINGUAL_TABLET | SUBLINGUAL | Status: DC
Start: ? — End: 2017-11-05

## 2017-11-05 MED ORDER — INSULIN LISPRO 100 UNIT/ML ~~LOC~~ SOLN
1.00 | SUBCUTANEOUS | Status: DC
Start: 2017-11-03 — End: 2017-11-05

## 2017-11-05 MED ORDER — MIRTAZAPINE 15 MG PO TABS
7.50 | ORAL_TABLET | ORAL | Status: DC
Start: 2017-11-03 — End: 2017-11-05

## 2017-11-17 DIAGNOSIS — R112 Nausea with vomiting, unspecified: Secondary | ICD-10-CM | POA: Diagnosis not present

## 2017-11-17 DIAGNOSIS — R2689 Other abnormalities of gait and mobility: Secondary | ICD-10-CM | POA: Diagnosis not present

## 2017-11-17 DIAGNOSIS — K3184 Gastroparesis: Secondary | ICD-10-CM | POA: Diagnosis not present

## 2017-11-17 DIAGNOSIS — F1210 Cannabis abuse, uncomplicated: Secondary | ICD-10-CM | POA: Diagnosis not present

## 2017-11-17 DIAGNOSIS — D649 Anemia, unspecified: Secondary | ICD-10-CM | POA: Diagnosis not present

## 2017-11-17 DIAGNOSIS — E1065 Type 1 diabetes mellitus with hyperglycemia: Secondary | ICD-10-CM | POA: Diagnosis not present

## 2017-11-17 DIAGNOSIS — E1165 Type 2 diabetes mellitus with hyperglycemia: Secondary | ICD-10-CM | POA: Diagnosis not present

## 2017-11-17 DIAGNOSIS — M25551 Pain in right hip: Secondary | ICD-10-CM | POA: Diagnosis not present

## 2017-11-17 DIAGNOSIS — Z7151 Drug abuse counseling and surveillance of drug abuser: Secondary | ICD-10-CM | POA: Diagnosis not present

## 2017-11-17 DIAGNOSIS — Z794 Long term (current) use of insulin: Secondary | ICD-10-CM | POA: Diagnosis not present

## 2017-11-17 DIAGNOSIS — E1142 Type 2 diabetes mellitus with diabetic polyneuropathy: Secondary | ICD-10-CM | POA: Diagnosis not present

## 2017-11-17 DIAGNOSIS — F1410 Cocaine abuse, uncomplicated: Secondary | ICD-10-CM | POA: Diagnosis not present

## 2017-11-17 DIAGNOSIS — Z716 Tobacco abuse counseling: Secondary | ICD-10-CM | POA: Diagnosis not present

## 2017-11-17 DIAGNOSIS — Z9114 Patient's other noncompliance with medication regimen: Secondary | ICD-10-CM | POA: Diagnosis not present

## 2017-11-17 DIAGNOSIS — E1042 Type 1 diabetes mellitus with diabetic polyneuropathy: Secondary | ICD-10-CM | POA: Diagnosis not present

## 2017-11-17 DIAGNOSIS — F329 Major depressive disorder, single episode, unspecified: Secondary | ICD-10-CM | POA: Diagnosis not present

## 2017-11-17 DIAGNOSIS — E43 Unspecified severe protein-calorie malnutrition: Secondary | ICD-10-CM | POA: Diagnosis not present

## 2017-11-17 DIAGNOSIS — E1010 Type 1 diabetes mellitus with ketoacidosis without coma: Secondary | ICD-10-CM | POA: Diagnosis not present

## 2017-11-17 DIAGNOSIS — E1043 Type 1 diabetes mellitus with diabetic autonomic (poly)neuropathy: Secondary | ICD-10-CM | POA: Diagnosis not present

## 2017-11-17 DIAGNOSIS — E871 Hypo-osmolality and hyponatremia: Secondary | ICD-10-CM | POA: Diagnosis not present

## 2017-11-17 DIAGNOSIS — R05 Cough: Secondary | ICD-10-CM | POA: Diagnosis not present

## 2017-11-17 LAB — HEMOGLOBIN A1C: HEMOGLOBIN A1C: 10.2

## 2017-11-18 DIAGNOSIS — E1010 Type 1 diabetes mellitus with ketoacidosis without coma: Secondary | ICD-10-CM | POA: Diagnosis not present

## 2017-11-18 DIAGNOSIS — F1410 Cocaine abuse, uncomplicated: Secondary | ICD-10-CM | POA: Diagnosis not present

## 2017-11-18 DIAGNOSIS — E1065 Type 1 diabetes mellitus with hyperglycemia: Secondary | ICD-10-CM | POA: Diagnosis not present

## 2017-11-18 DIAGNOSIS — E1040 Type 1 diabetes mellitus with diabetic neuropathy, unspecified: Secondary | ICD-10-CM | POA: Diagnosis not present

## 2017-11-18 DIAGNOSIS — E1043 Type 1 diabetes mellitus with diabetic autonomic (poly)neuropathy: Secondary | ICD-10-CM | POA: Diagnosis not present

## 2017-11-18 DIAGNOSIS — E1042 Type 1 diabetes mellitus with diabetic polyneuropathy: Secondary | ICD-10-CM | POA: Diagnosis not present

## 2017-11-18 DIAGNOSIS — K3184 Gastroparesis: Secondary | ICD-10-CM | POA: Diagnosis not present

## 2017-11-18 DIAGNOSIS — F1210 Cannabis abuse, uncomplicated: Secondary | ICD-10-CM | POA: Diagnosis not present

## 2017-11-19 DIAGNOSIS — K3184 Gastroparesis: Secondary | ICD-10-CM | POA: Diagnosis not present

## 2017-11-19 DIAGNOSIS — E1042 Type 1 diabetes mellitus with diabetic polyneuropathy: Secondary | ICD-10-CM | POA: Diagnosis not present

## 2017-11-19 DIAGNOSIS — F1410 Cocaine abuse, uncomplicated: Secondary | ICD-10-CM | POA: Diagnosis not present

## 2017-11-19 DIAGNOSIS — E1040 Type 1 diabetes mellitus with diabetic neuropathy, unspecified: Secondary | ICD-10-CM | POA: Diagnosis not present

## 2017-11-19 DIAGNOSIS — E1065 Type 1 diabetes mellitus with hyperglycemia: Secondary | ICD-10-CM | POA: Diagnosis not present

## 2017-11-19 DIAGNOSIS — E1043 Type 1 diabetes mellitus with diabetic autonomic (poly)neuropathy: Secondary | ICD-10-CM | POA: Diagnosis not present

## 2017-11-19 DIAGNOSIS — F1210 Cannabis abuse, uncomplicated: Secondary | ICD-10-CM | POA: Diagnosis not present

## 2017-11-30 ENCOUNTER — Telehealth: Payer: Self-pay | Admitting: Family Medicine

## 2017-11-30 NOTE — Telephone Encounter (Signed)
Patient recently seen in the ED. Please schedule follow up with me soon if I am still acting as his PCP.

## 2017-11-30 NOTE — Telephone Encounter (Signed)
Called the patient but a recording came on that said patient has a vm that has not been setup. Will try back later. Rhonda Cunningham,CMA

## 2017-12-19 DIAGNOSIS — K769 Liver disease, unspecified: Secondary | ICD-10-CM | POA: Diagnosis not present

## 2017-12-19 DIAGNOSIS — E119 Type 2 diabetes mellitus without complications: Secondary | ICD-10-CM | POA: Diagnosis not present

## 2017-12-19 DIAGNOSIS — B20 Human immunodeficiency virus [HIV] disease: Secondary | ICD-10-CM | POA: Diagnosis not present

## 2017-12-19 DIAGNOSIS — A539 Syphilis, unspecified: Secondary | ICD-10-CM | POA: Diagnosis not present

## 2017-12-19 DIAGNOSIS — E785 Hyperlipidemia, unspecified: Secondary | ICD-10-CM | POA: Diagnosis not present

## 2017-12-19 DIAGNOSIS — B009 Herpesviral infection, unspecified: Secondary | ICD-10-CM | POA: Diagnosis not present

## 2017-12-26 ENCOUNTER — Telehealth: Payer: Self-pay | Admitting: Family Medicine

## 2017-12-26 ENCOUNTER — Ambulatory Visit (INDEPENDENT_AMBULATORY_CARE_PROVIDER_SITE_OTHER): Payer: BLUE CROSS/BLUE SHIELD | Admitting: Family Medicine

## 2017-12-26 DIAGNOSIS — Z5329 Procedure and treatment not carried out because of patient's decision for other reasons: Secondary | ICD-10-CM

## 2017-12-26 NOTE — Telephone Encounter (Signed)
It has been over 2 years since Jake Howell was last seen and he did not attend his visit today. Am I still his PCP? Please ask pt to clarify and reschedule if so.

## 2017-12-27 ENCOUNTER — Encounter: Payer: BLUE CROSS/BLUE SHIELD | Admitting: Family Medicine

## 2017-12-27 NOTE — Telephone Encounter (Signed)
Patient has appointment today for a annual. Rhonda Cunningham,CMA

## 2018-01-09 ENCOUNTER — Ambulatory Visit (INDEPENDENT_AMBULATORY_CARE_PROVIDER_SITE_OTHER): Payer: BLUE CROSS/BLUE SHIELD | Admitting: Family Medicine

## 2018-01-09 ENCOUNTER — Encounter: Payer: Self-pay | Admitting: Family Medicine

## 2018-01-09 VITALS — BP 116/80 | HR 71 | Ht 69.0 in | Wt 110.0 lb

## 2018-01-09 DIAGNOSIS — E1143 Type 2 diabetes mellitus with diabetic autonomic (poly)neuropathy: Secondary | ICD-10-CM | POA: Diagnosis not present

## 2018-01-09 DIAGNOSIS — Z23 Encounter for immunization: Secondary | ICD-10-CM | POA: Diagnosis not present

## 2018-01-09 DIAGNOSIS — R202 Paresthesia of skin: Secondary | ICD-10-CM

## 2018-01-09 DIAGNOSIS — R809 Proteinuria, unspecified: Secondary | ICD-10-CM

## 2018-01-09 DIAGNOSIS — G4700 Insomnia, unspecified: Secondary | ICD-10-CM | POA: Diagnosis not present

## 2018-01-09 DIAGNOSIS — F411 Generalized anxiety disorder: Secondary | ICD-10-CM | POA: Diagnosis not present

## 2018-01-09 DIAGNOSIS — E1040 Type 1 diabetes mellitus with diabetic neuropathy, unspecified: Secondary | ICD-10-CM | POA: Diagnosis not present

## 2018-01-09 DIAGNOSIS — F330 Major depressive disorder, recurrent, mild: Secondary | ICD-10-CM | POA: Diagnosis not present

## 2018-01-09 DIAGNOSIS — K3184 Gastroparesis: Secondary | ICD-10-CM

## 2018-01-09 LAB — POCT GLYCOSYLATED HEMOGLOBIN (HGB A1C): HEMOGLOBIN A1C: 11.2 % — AB (ref 4.0–5.6)

## 2018-01-09 MED ORDER — LISINOPRIL 5 MG PO TABS: 5.0000 mg | ORAL_TABLET | Freq: Every day | ORAL | 3 refills | Status: DC

## 2018-01-09 MED ORDER — MIRTAZAPINE 7.5 MG PO TABS: 7.5000 mg | ORAL_TABLET | Freq: Every day | ORAL | 1 refills | Status: AC

## 2018-01-09 MED ORDER — INSULIN DEGLUDEC 100 UNIT/ML ~~LOC~~ SOPN: 30.0000 [IU] | PEN_INJECTOR | Freq: Every day | SUBCUTANEOUS | 3 refills | Status: AC

## 2018-01-09 MED ORDER — AMBULATORY NON FORMULARY MEDICATION: 0 refills | Status: AC

## 2018-01-09 MED ORDER — INSULIN PEN NEEDLE 31G X 5 MM MISC: 1.0000 | Freq: Four times a day (QID) | 12 refills | Status: AC

## 2018-01-09 MED ORDER — INSULIN ASPART 100 UNIT/ML FLEXPEN: PEN_INJECTOR | SUBCUTANEOUS | 11 refills | Status: AC

## 2018-01-09 NOTE — Progress Notes (Signed)
Jake Howell is a 27 y.o. male who presents to [facility] Primary Care Sports Medicine today for diabetes follow-up.  Jake Howell was previously seen in my clinic about 2 years ago most recently.  His diabetes was previously managed with endocrinology here in [place] as part of [facility].  However he is moved to [facility] and is looking to reestablish care in [place] possibility.  His diabetes has never been particularly well controlled however it has been worse recently because he is run out of the supplies and insulin for his pump.  He last was seen by his endocrinologist about 6 months ago.  He has been using regular insulin 15 units twice daily and NovoLog sliding scale insulin.  He has the protocol at home but not with him and he cannot exactly reproduce it but it does adjust based on sugars and carbohydrates.  He notes his blood sugars have been in the 200s recently but has been out of DKA for the last several months.  He notes polyuria and polydipsia.  Until he can get reestablished with endocrinology he did like to go back to basal bolus therapy which had worked with some degree of success previously.  Additionally he notes insomnia and anxiety has been typically pretty well controlled with mirtazapine at bedtime.  He notes this has also improved his appetite.  He would like to have this medication refilled if possible as well.  Of note today's note was originally scheduled for wellness exam however patient agreed to transition to problem focused visit based on his diabetes  ROS as above:  Exam:  BP 116/80   Pulse 71   Ht 5\' 9"  (1.753 m)   Wt 110 lb (49.9 kg)   BMI 16.24 kg/m  Gen: NAD cachectic appearing HEENT: EOMI,  MMM Lungs: Normal work of breathing. CTABL Heart: RRR no MRG Abd: NABS, Soft. Nondistended, Nontender Exts: Brisk capillary refill, warm and well perfused.   Lab and Radiology  Results Results for orders placed or performed in visit on 01/09/18 (from the past 72 hour(s))  POCT HgB A1C     Status: Abnormal   Collection Time: 01/09/18  4:43 PM  Result Value Ref Range   Hemoglobin A1C 11.2 (A) 4.0 - 5.6 %   HbA1c POC (<> result, manual entry)  4.0 - 5.6 %   HbA1c, POC (prediabetic range)  5.7 - 6.4 %   HbA1c, POC (controlled diabetic range)  0.0 - 7.0 %   No results found.  Labs recent from [facility]: Care Everywhere Result Report Basic Metabolic PanelResulted: 11/03/2017 6:21 AM [facility] Component Name Value Ref Range  Na 138 136 - 146 mmol/L  Potassium 4.0 3.7 - 5.4 mmol/L  Cl 100 97 - 108 mmol/L  CO2 24 20 - 32 mmol/L  Glucose 308 (H) 65 - 99 mg/dL  BUN 7 6 - 20 mg/dL  Creatinine 7.82 (L) 9.56 - 1.27 mg/dL  Ca 8.9 8.7 - 21.3 mg/dL  BUN/CREAT RATIO 08.6 11 - 26   GFR AFRICAN AMERICAN 174  Comment:  African-American:  Normal GFR (glomerular filtration rate) > 60 mL/min/1.73 meters squared. < 60 may include impaired kidney function based on creatinine, age, legal sex, and race normalized to accepted average body surface area mL/min/1.65m2  GFR Non African American 151  Comment:  Non African American:  Normal GFR (glomerular filtration rate) > 60 mL/min/1.73 meters squared. < 60 may include impaired kidney function based on creatinine, age, legal  sex, and race normalized to accepted average body surface area. mL/min/1.19m2  AGAP 14 7 - 16 mmol/L  Specimen Collected on  Blood 11/03/2017 5:26 AM     Assessment and Plan: 27 y.o. male with  Diabetes: Uncontrolled currently but better than it really could be.  Plan to refer to endocrinology in [place] with the anticipation of getting back on insulin pump.  In the meantime we will switch to basal bolus therapy with Tresiba insulin or equivalent long-acting insulin if not available NovoLog with sliding scale.  Plan to self titrate [place] based on fasting blood sugar.  Plan to recheck with me in 1 month  and patient is advised to bring the sliding scale protocol that he is been using.  Recheck in 1 month.  Anxiety and insomnia: Previously well controlled with mirtazapine.  Plan to restart.  Diabetic nephropathy: Restart low-dose lisinopril.  Creatinine clearance reasonable at last check in May at [place].  Plan to recheck metabolic panel in the near future.  Pneumonia 23 vaccine given today prior to discharge  Orders Placed This Encounter  Procedures  . Pneumococcal polysaccharide vaccine 23-valent greater than or equal to 2yo subcutaneous/IM  . Ambulatory referral to Endocrinology    Referral Priority:   Urgent    Referral Type:   Consultation    Referral Reason:   Specialty Services Required    Number of Visits Requested:   1  . POCT HgB A1C   Meds ordered this encounter  Medications  . insulin degludec (TRESIBA FLEXTOUCH) 100 UNIT/ML SOPN FlexTouch Pen    Sig: Inject 0.3 mLs (30 Units total) into the skin daily.    Dispense:  3 pen    Refill:  3  . mirtazapine (REMERON) 7.5 MG tablet    Sig: Take 1 tablet (7.5 mg total) by mouth at bedtime.    Dispense:  90 tablet    Refill:  1  . Insulin Pen Needle (ASSURE ID SAFETY PEN NEEDLES) 31G X 5 MM MISC    Sig: 1 each by Does not apply route 4 (four) times daily.    Dispense:  100 each    Refill:  12  . insulin aspart (NOVOLOG FLEXPEN) 100 UNIT/ML FlexPen    Sig: SSI 3x daily with meals    Dispense:  15 mL    Refill:  11  . lisinopril (PRINIVIL,ZESTRIL) 5 MG tablet    Sig: Take 1 tablet (5 mg total) by mouth daily.    Dispense:  90 tablet    Refill:  3  . AMBULATORY NON FORMULARY MEDICATION    Sig: Test strips for 4x daily testing.  One touch ultra. E10.40 uncontrolled DM1    Dispense:  1 each    Refill:  0     Historical information moved to improve visibility of documentation.  Past Medical History:  Diagnosis Date  . Depression   . Diabetes mellitus   . Gastroparesis due to DM (HCC) 09/08/2015  . Scoliosis     History reviewed. No pertinent surgical history. Social History   Tobacco Use  . Smoking status: Current Every Day Smoker    Packs/day: 0.50    Years: 1.00    Pack years: 0.50  . Smokeless tobacco: Never Used  Substance Use Topics  . Alcohol use: No   family history includes Cancer in his paternal grandmother; Crohn's disease in his father; Diabetes in his paternal grandmother; Stroke in his maternal uncle.  Medications: Current Outpatient Medications  Medication Sig Dispense Refill  .  AMBULATORY NON FORMULARY MEDICATION Test strips for 4x daily testing.  One touch ultra. E10.40 uncontrolled DM1 1 each 0  . insulin aspart (NOVOLOG FLEXPEN) 100 UNIT/ML FlexPen SSI 3x daily with meals 15 mL 11  . insulin degludec (TRESIBA FLEXTOUCH) 100 UNIT/ML SOPN FlexTouch Pen Inject 0.3 mLs (30 Units total) into the skin daily. 3 pen 3  . Insulin Pen Needle (ASSURE ID SAFETY PEN NEEDLES) 31G X 5 MM MISC 1 each by Does not apply route 4 (four) times daily. 100 each 12  . lisinopril (PRINIVIL,ZESTRIL) 5 MG tablet Take 1 tablet (5 mg total) by mouth daily. 90 tablet 3  . mirtazapine (REMERON) 7.5 MG tablet Take 1 tablet (7.5 mg total) by mouth at bedtime. 90 tablet 1   No current facility-administered medications for this visit.    No Known Allergies   Discussed warning signs or symptoms. Please see discharge instructions. Patient expresses understanding.

## 2018-01-09 NOTE — Patient Instructions (Signed)
Thank you for coming in today. Start Triseba 30 units daily.  Use Sliding scale.  Keep track of blood sugar.  Increase the triseba by 2 units every 3 days the fasting sugar is more than 140.  Bring the SSI instructions to the next visit and to the endocronology visit.   Recheck with me in 1 month.

## 2018-01-10 NOTE — Progress Notes (Signed)
Note opened in error due to no show

## 2018-01-17 ENCOUNTER — Encounter: Payer: Self-pay | Admitting: Family Medicine

## 2018-01-17 LAB — CALCIUM: CALCIUM: 8.9

## 2018-02-09 ENCOUNTER — Ambulatory Visit: Payer: BLUE CROSS/BLUE SHIELD | Admitting: Family Medicine

## 2018-02-16 ENCOUNTER — Ambulatory Visit: Payer: BLUE CROSS/BLUE SHIELD | Admitting: Family Medicine

## 2018-02-17 ENCOUNTER — Encounter: Payer: Self-pay | Admitting: Family Medicine

## 2018-02-23 ENCOUNTER — Observation Stay (HOSPITAL_COMMUNITY): Payer: BLUE CROSS/BLUE SHIELD

## 2018-02-23 ENCOUNTER — Encounter (HOSPITAL_COMMUNITY): Payer: Self-pay

## 2018-02-23 ENCOUNTER — Other Ambulatory Visit: Payer: Self-pay

## 2018-02-23 ENCOUNTER — Inpatient Hospital Stay (HOSPITAL_COMMUNITY)
Admission: EM | Admit: 2018-02-23 | Discharge: 2018-02-28 | DRG: 074 | Disposition: A | Discharge status: Home / Self Care | Payer: BLUE CROSS/BLUE SHIELD | Attending: Internal Medicine | Admitting: Internal Medicine

## 2018-02-23 DIAGNOSIS — G4700 Insomnia, unspecified: Secondary | ICD-10-CM | POA: Diagnosis not present

## 2018-02-23 DIAGNOSIS — Z794 Long term (current) use of insulin: Secondary | ICD-10-CM | POA: Diagnosis not present

## 2018-02-23 DIAGNOSIS — F319 Bipolar disorder, unspecified: Secondary | ICD-10-CM | POA: Diagnosis present

## 2018-02-23 DIAGNOSIS — R109 Unspecified abdominal pain: Secondary | ICD-10-CM | POA: Diagnosis present

## 2018-02-23 DIAGNOSIS — R110 Nausea: Secondary | ICD-10-CM | POA: Diagnosis not present

## 2018-02-23 DIAGNOSIS — E1040 Type 1 diabetes mellitus with diabetic neuropathy, unspecified: Secondary | ICD-10-CM

## 2018-02-23 DIAGNOSIS — E1043 Type 1 diabetes mellitus with diabetic autonomic (poly)neuropathy: Secondary | ICD-10-CM | POA: Diagnosis not present

## 2018-02-23 DIAGNOSIS — R1084 Generalized abdominal pain: Secondary | ICD-10-CM | POA: Diagnosis not present

## 2018-02-23 DIAGNOSIS — R112 Nausea with vomiting, unspecified: Secondary | ICD-10-CM | POA: Diagnosis present

## 2018-02-23 DIAGNOSIS — R945 Abnormal results of liver function studies: Secondary | ICD-10-CM | POA: Diagnosis not present

## 2018-02-23 DIAGNOSIS — K219 Gastro-esophageal reflux disease without esophagitis: Secondary | ICD-10-CM | POA: Diagnosis not present

## 2018-02-23 DIAGNOSIS — K819 Cholecystitis, unspecified: Secondary | ICD-10-CM

## 2018-02-23 DIAGNOSIS — R1013 Epigastric pain: Secondary | ICD-10-CM

## 2018-02-23 DIAGNOSIS — E1143 Type 2 diabetes mellitus with diabetic autonomic (poly)neuropathy: Secondary | ICD-10-CM | POA: Diagnosis present

## 2018-02-23 DIAGNOSIS — K3184 Gastroparesis: Secondary | ICD-10-CM | POA: Diagnosis not present

## 2018-02-23 DIAGNOSIS — Z87891 Personal history of nicotine dependence: Secondary | ICD-10-CM

## 2018-02-23 DIAGNOSIS — R7989 Other specified abnormal findings of blood chemistry: Secondary | ICD-10-CM | POA: Diagnosis not present

## 2018-02-23 DIAGNOSIS — E1049 Type 1 diabetes mellitus with other diabetic neurological complication: Secondary | ICD-10-CM | POA: Diagnosis present

## 2018-02-23 HISTORY — DX: Vitamin D deficiency, unspecified: E55.9

## 2018-02-23 HISTORY — DX: Polyneuropathy, unspecified: G62.9

## 2018-02-23 HISTORY — DX: Gastroparesis: K31.84

## 2018-02-23 LAB — URINALYSIS, ROUTINE W REFLEX MICROSCOPIC
BILIRUBIN URINE: NEGATIVE
HGB URINE DIPSTICK: NEGATIVE
Ketones, ur: 80 mg/dL — AB
LEUKOCYTES UA: NEGATIVE
Nitrite: NEGATIVE
PH: 8 (ref 5.0–8.0)
Protein, ur: 100 mg/dL — AB
SPECIFIC GRAVITY, URINE: 1.032 — AB (ref 1.005–1.030)

## 2018-02-23 LAB — COMPREHENSIVE METABOLIC PANEL
ALK PHOS: 101 U/L (ref 38–126)
ALT: 112 U/L — AB (ref 0–44)
AST: 51 U/L — AB (ref 15–41)
Albumin: 4.3 g/dL (ref 3.5–5.0)
Anion gap: 12 (ref 5–15)
BILIRUBIN TOTAL: 1.2 mg/dL (ref 0.3–1.2)
BUN: 21 mg/dL — AB (ref 6–20)
CHLORIDE: 102 mmol/L (ref 98–111)
CO2: 26 mmol/L (ref 22–32)
CREATININE: 0.67 mg/dL (ref 0.61–1.24)
Calcium: 9.5 mg/dL (ref 8.9–10.3)
GFR calc Af Amer: >60 mL/min (ref >60)
Glucose, Bld: 256 mg/dL — ABNORMAL HIGH (ref 70–99)
Potassium: 3.9 mmol/L (ref 3.5–5.1)
Sodium: 140 mmol/L (ref 135–145)
Total Protein: 7.6 g/dL (ref 6.5–8.1)

## 2018-02-23 LAB — CBC
HCT: 42.7 % (ref 39.0–52.0)
Hemoglobin: 14.7 g/dL (ref 13.0–17.0)
MCH: 28.1 pg (ref 26.0–34.0)
MCHC: 34.4 g/dL (ref 30.0–36.0)
MCV: 81.5 fL (ref 78.0–100.0)
PLATELETS: 326 10*3/uL (ref 150–400)
RBC: 5.24 MIL/uL (ref 4.22–5.81)
RDW: 13 % (ref 11.5–15.5)
WBC: 8.8 10*3/uL (ref 4.0–10.5)

## 2018-02-23 LAB — RAPID URINE DRUG SCREEN, HOSP PERFORMED
AMPHETAMINES: NOT DETECTED
BARBITURATES: NOT DETECTED
Benzodiazepines: NOT DETECTED
Cocaine: NOT DETECTED
OPIATES: NOT DETECTED
TETRAHYDROCANNABINOL: NOT DETECTED

## 2018-02-23 LAB — GLUCOSE, CAPILLARY: GLUCOSE-CAPILLARY: 234 mg/dL — AB (ref 70–99)

## 2018-02-23 LAB — CBG MONITORING, ED
Glucose-Capillary: 217 mg/dL — ABNORMAL HIGH (ref 70–99)
Glucose-Capillary: 230 mg/dL — ABNORMAL HIGH (ref 70–99)

## 2018-02-23 LAB — LIPASE, BLOOD: LIPASE: 26 U/L (ref 11–51)

## 2018-02-23 MED ORDER — HALOPERIDOL LACTATE 5 MG/ML IJ SOLN
2.0000 mg | Freq: Once | INTRAMUSCULAR | Status: AC
  Administered 2018-02-23: 2 mg via INTRAVENOUS
  Filled 2018-02-23: qty 1

## 2018-02-23 MED ORDER — SODIUM CHLORIDE 0.9 % IV SOLN
INTRAVENOUS | Status: AC
  Administered 2018-02-23 – 2018-02-24 (×2): via INTRAVENOUS

## 2018-02-23 MED ORDER — INSULIN DEGLUDEC 100 UNIT/ML ~~LOC~~ SOPN: 30.0000 [IU] | PEN_INJECTOR | Freq: Once | SUBCUTANEOUS | Status: DC

## 2018-02-23 MED ORDER — MIRTAZAPINE 15 MG PO TABS
7.5000 mg | ORAL_TABLET | Freq: Every day | ORAL | Status: DC
  Administered 2018-02-23 – 2018-02-27 (×5): 7.5 mg via ORAL
  Filled 2018-02-23 (×5): qty 1

## 2018-02-23 MED ORDER — INSULIN GLARGINE 100 UNIT/ML ~~LOC~~ SOLN
30.0000 [IU] | Freq: Once | SUBCUTANEOUS | Status: AC
  Administered 2018-02-23: 30 [IU] via SUBCUTANEOUS
  Filled 2018-02-23: qty 0.3

## 2018-02-23 MED ORDER — SODIUM CHLORIDE 0.9 % IV BOLUS
1000.0000 mL | Freq: Once | INTRAVENOUS | Status: AC
  Administered 2018-02-23: 1000 mL via INTRAVENOUS

## 2018-02-23 MED ORDER — PROMETHAZINE HCL 25 MG/ML IJ SOLN
25.0000 mg | Freq: Once | INTRAMUSCULAR | Status: AC
  Administered 2018-02-23: 25 mg via INTRAVENOUS
  Filled 2018-02-23: qty 1

## 2018-02-23 MED ORDER — PROMETHAZINE HCL 25 MG/ML IJ SOLN
12.5000 mg | Freq: Four times a day (QID) | INTRAMUSCULAR | Status: DC | PRN
  Administered 2018-02-24 (×2): 12.5 mg via INTRAVENOUS
  Filled 2018-02-23 (×3): qty 1

## 2018-02-23 MED ORDER — MORPHINE SULFATE (PF) 4 MG/ML IV SOLN
4.0000 mg | Freq: Once | INTRAVENOUS | Status: AC
  Administered 2018-02-23: 4 mg via INTRAVENOUS
  Filled 2018-02-23: qty 1

## 2018-02-23 MED ORDER — ONDANSETRON HCL 4 MG/2ML IJ SOLN
4.0000 mg | Freq: Four times a day (QID) | INTRAMUSCULAR | Status: DC | PRN
  Administered 2018-02-24 – 2018-02-27 (×10): 4 mg via INTRAVENOUS
  Filled 2018-02-23 (×10): qty 2

## 2018-02-23 MED ORDER — HYDROMORPHONE HCL 1 MG/ML IJ SOLN
0.5000 mg | INTRAMUSCULAR | Status: DC | PRN
  Administered 2018-02-24 (×2): 0.5 mg via INTRAVENOUS
  Filled 2018-02-23 (×2): qty 0.5

## 2018-02-23 MED ORDER — INSULIN ASPART 100 UNIT/ML ~~LOC~~ SOLN
0.0000 [IU] | SUBCUTANEOUS | Status: DC
  Administered 2018-02-23: 3 [IU] via SUBCUTANEOUS
  Administered 2018-02-24: 2 [IU] via SUBCUTANEOUS
  Administered 2018-02-24: 1 [IU] via SUBCUTANEOUS

## 2018-02-23 MED ORDER — FAMOTIDINE IN NACL 20-0.9 MG/50ML-% IV SOLN
20.0000 mg | Freq: Two times a day (BID) | INTRAVENOUS | Status: DC
  Administered 2018-02-23 – 2018-02-25 (×4): 20 mg via INTRAVENOUS
  Filled 2018-02-23 (×4): qty 50

## 2018-02-23 MED ORDER — ENOXAPARIN SODIUM 40 MG/0.4ML ~~LOC~~ SOLN
40.0000 mg | SUBCUTANEOUS | Status: DC
  Filled 2018-02-23 (×5): qty 0.4

## 2018-02-23 MED ORDER — HYDROMORPHONE HCL 1 MG/ML IJ SOLN
1.0000 mg | Freq: Once | INTRAMUSCULAR | Status: AC
  Administered 2018-02-23: 1 mg via INTRAVENOUS
  Filled 2018-02-23: qty 1

## 2018-02-23 NOTE — ED Notes (Signed)
Patient unable to PO challenge at this time. Patient still extremely nauseated. EDPA notified.

## 2018-02-23 NOTE — ED Notes (Signed)
Patient states nausea is slightly better, but no improvement of pain.

## 2018-02-23 NOTE — ED Notes (Signed)
ED TO INPATIENT HANDOFF REPORT  Name/Age/Gender Jake Howell 27 y.o. male  Code Status   Home/SNF/Other Home  Chief Complaint abd pain   Level of Care/Admitting Diagnosis ED Disposition    ED Disposition Condition [place] [facility] Area: [place] [[id number]]  Level of Care: Telemetry [5]  Admit to tele based on following criteria: Monitor for Ischemic changes  Diagnosis: Nausea & vomiting [119147]  Admitting Physician: Jani Gravel [[id number]]  Attending Physician: Jani Gravel [[id number]]  PT Class (Do Not Modify): Observation [104]  PT Acc Code (Do Not Modify): Observation [10022]       Medical History Past Medical History:  Diagnosis Date  . Depression   . Diabetes mellitus   . Gastroparesis   . Gastroparesis due to DM ([place]) 09/08/2015  . Neuropathy   . Scoliosis   . Vitamin D deficiency     Allergies No Known Allergies  IV Location/Drains/Wounds Patient Lines/Drains/Airways Status   Active Line/Drains/Airways    Name:   Placement date:   Placement time:   Site:   Days:   Peripheral IV 02/23/18 Left Antecubital   02/23/18    1445    Antecubital   less than 1          Labs/Imaging Results for orders placed or performed during the hospital encounter of 02/23/18 (from the past 48 hour(s))  CBG monitoring, ED     Status: Abnormal   Collection Time: 02/23/18  2:10 PM  Result Value Ref Range   Glucose-Capillary 217 (H) 70 - 99 mg/dL  Lipase, blood     Status: None   Collection Time: 02/23/18  2:50 PM  Result Value Ref Range   Lipase 26 11 - 51 U/L    Comment: Performed at [facility], [place] [address] [place], [place] [address]  Comprehensive metabolic panel     Status: Abnormal   Collection Time: 02/23/18  2:50 PM  Result Value Ref Range   Sodium 140 135 - 145 mmol/L   Potassium 3.9 3.5 - 5.1 mmol/L   Chloride 102 98 - 111 mmol/L   CO2 26 22 - 32 mmol/L   Glucose, Bld 256 (H) 70 - 99 mg/dL   BUN 21 (H) 6 - 20  mg/dL   Creatinine, Ser 0.67 0.61 - 1.24 mg/dL   Calcium 9.5 8.9 - 10.3 mg/dL   Total Protein 7.6 6.5 - 8.1 g/dL   Albumin 4.3 3.5 - 5.0 g/dL   AST 51 (H) 15 - 41 U/L   ALT 112 (H) 0 - 44 U/L   Alkaline Phosphatase 101 38 - 126 U/L   Total Bilirubin 1.2 0.3 - 1.2 mg/dL   GFR calc non Af Amer >60 >60 mL/min   GFR calc Af Amer >60 >60 mL/min    Comment: (NOTE) The eGFR has been calculated using the CKD EPI equation. This calculation has not been validated in all clinical situations. eGFR's persistently <60 mL/min signify possible Chronic Kidney Disease.    Anion gap 12 5 - 15    Comment: Performed at [facility], [place] [address] [place], [place] [address]  CBC     Status: None   Collection Time: 02/23/18  2:50 PM  Result Value Ref Range   WBC 8.8 4.0 - 10.5 K/uL   RBC 5.24 4.22 - 5.81 MIL/uL   Hemoglobin 14.7 13.0 - 17.0 g/dL   HCT 42.7 39.0 - 52.0 %   MCV 81.5 78.0 -  100.0 fL   MCH 28.1 26.0 - 34.0 pg   MCHC 34.4 30.0 - 36.0 g/dL   RDW 13.0 11.5 - 15.5 %   Platelets 326 150 - 400 K/uL    Comment: Performed at [facility], [place] [address], [place], [place] [address]  Urinalysis, Routine w reflex microscopic     Status: Abnormal   Collection Time: 02/23/18  5:00 PM  Result Value Ref Range   Color, Urine AMBER (A) YELLOW    Comment: BIOCHEMICALS MAY BE AFFECTED BY COLOR   APPearance HAZY (A) CLEAR   Specific Gravity, Urine 1.032 (H) 1.005 - 1.030   pH 8.0 5.0 - 8.0   Glucose, UA >=500 (A) NEGATIVE mg/dL   Hgb urine dipstick NEGATIVE NEGATIVE   Bilirubin Urine NEGATIVE NEGATIVE   Ketones, ur 80 (A) NEGATIVE mg/dL   Protein, ur 100 (A) NEGATIVE mg/dL   Nitrite NEGATIVE NEGATIVE   Leukocytes, UA NEGATIVE NEGATIVE   RBC / HPF 0-5 0 - 5 RBC/hpf   WBC, UA 0-5 0 - 5 WBC/hpf   Bacteria, UA MANY (A) NONE SEEN   Squamous Epithelial / LPF 0-5 0 - 5   Mucus PRESENT     Comment: Performed at [facility], [place] [address], [place], [place] [address]  Urine rapid drug screen (hosp performed)     Status: None   Collection Time: 02/23/18  5:00 PM  Result Value Ref Range   Opiates NONE DETECTED NONE DETECTED   Cocaine NONE DETECTED NONE DETECTED   Benzodiazepines NONE DETECTED NONE DETECTED   Amphetamines NONE DETECTED NONE DETECTED   Tetrahydrocannabinol NONE DETECTED NONE DETECTED   Barbiturates NONE DETECTED NONE DETECTED    Comment: (NOTE) DRUG SCREEN FOR MEDICAL PURPOSES ONLY.  IF CONFIRMATION IS NEEDED FOR ANY PURPOSE, NOTIFY LAB WITHIN 5 DAYS. LOWEST DETECTABLE LIMITS FOR URINE DRUG SCREEN Drug Class                     Cutoff (ng/mL) Amphetamine and metabolites    1000 Barbiturate and metabolites    200 Benzodiazepine                 025 Tricyclics and metabolites     300 Opiates and metabolites        300 Cocaine and metabolites        300 THC                            50 Performed at [facility], [place] [address], [place], [place] [address]   CBG monitoring, ED     Status: Abnormal   Collection Time: 02/23/18  8:26 PM  Result Value Ref Range   Glucose-Capillary 230 (H) 70 - 99 mg/dL   Comment 1 Notify RN    No results found.  Pending Labs Unresulted Labs (From admission, onward)    Start     Ordered   02/23/18 2031  Urine culture  Add-on,   STAT     02/23/18 2030          Vitals/Pain Today's Vitals   02/23/18 1825 02/23/18 1930 02/23/18 1931 02/23/18 2000  BP: (!) 152/96   (!) 158/92  Pulse: 100   95  Resp: 16   16  Temp: 98.1 F (36.7 C)     TempSrc: Oral     SpO2: 100%   100%  Weight:  Height:      PainSc:  7  7      Isolation Precautions No active isolations  Medications Medications  sodium chloride 0.9 % bolus 1,000 mL (0 mLs Intravenous Stopped 02/23/18 1752)  sodium chloride 0.9 % bolus 1,000 mL (0 mLs Intravenous Stopped 02/23/18 1929)  promethazine (PHENERGAN) injection 25 mg (25 mg Intravenous Given 02/23/18 1801)  morphine 4 MG/ML  injection 4 mg (4 mg Intravenous Given 02/23/18 1804)  haloperidol lactate (HALDOL) injection 2 mg (2 mg Intravenous Given 02/23/18 1929)  insulin glargine (LANTUS) injection 30 Units (30 Units Subcutaneous Given 02/23/18 1944)  HYDROmorphone (DILAUDID) injection 1 mg (1 mg Intravenous Given 02/23/18 2040)    Mobility walks

## 2018-02-23 NOTE — ED Provider Notes (Signed)
[facility]-EMERGENCY DEPT Provider Note   CSN: [id number] Arrival date & time: 02/23/18  1352     History   Chief Complaint Chief Complaint  Patient presents with  . Abdominal Pain  . Emesis    HPI Jake Howell is a 27 y.o. male.  27yo male with history of insulin dependent diabetes, gastroparesis, previous admission for DKA presents with complaint of nausea, vomiting, upper abdominal pain onset last night. Also reports diarrhea, denies blood in stools or emesis. Emesis described as stomach contents. Denies recent travel, sick contacts. States episode today is similar to previous episodes of gastroparesis, does have a PCP, does not take Reglan due to side effects, has not taken another medications for his episode, patient was given Zofran by EMS and is no longer vomiting.  States his blood sugar is typically 140s-180s.     Past Medical History:  Diagnosis Date  . Depression   . Diabetes mellitus   . Gastroparesis   . Gastroparesis due to DM (HCC) 09/08/2015  . Neuropathy   . Scoliosis   . Vitamin D deficiency     Patient Active Problem List   Diagnosis Date Noted  . Scoliosis 11/18/2015  . Gastroparesis due to DM (HCC) 09/08/2015  . Nausea & vomiting 08/07/2015  . Abdominal pain 07/29/2015  . Paresthesia of both feet 07/29/2015  . Insomnia 07/29/2015  . Generalized anxiety disorder 07/29/2015  . Elevated blood pressure 07/29/2015  . Suicidal ideation 01/27/2013  . Depression 02/18/2012  . Type 1 diabetes with neurological complication (HCC) 02/16/2012  . Abnormal presence of protein in urine 12/31/2011  . Avitaminosis D 12/31/2011    History reviewed. No pertinent surgical history.      Home Medications    Prior to Admission medications   Medication Sig Start Date End Date Taking? Authorizing Provider  insulin aspart (NOVOLOG FLEXPEN) 100 UNIT/ML FlexPen SSI 3x daily with meals Patient taking differently: Inject 0-10 Units into the  skin 3 (three) times daily with meals. SSI 3x daily with meals 01/09/18  Yes Rodolph Bongorey, Evan S, MD  insulin degludec (TRESIBA FLEXTOUCH) 100 UNIT/ML SOPN FlexTouch Pen Inject 0.3 mLs (30 Units total) into the skin daily. 01/09/18  Yes Rodolph Bongorey, Evan S, MD  mirtazapine (REMERON) 7.5 MG tablet Take 1 tablet (7.5 mg total) by mouth at bedtime. 01/09/18  Yes Rodolph Bongorey, Evan S, MD  AMBULATORY NON FORMULARY MEDICATION Test strips for 4x daily testing.  One touch ultra. E10.40 uncontrolled DM1 01/09/18   Rodolph Bongorey, Evan S, MD  Insulin Pen Needle (ASSURE ID SAFETY PEN NEEDLES) 31G X 5 MM MISC 1 each by Does not apply route 4 (four) times daily. 01/09/18   Rodolph Bongorey, Evan S, MD  lisinopril (PRINIVIL,ZESTRIL) 5 MG tablet Take 1 tablet (5 mg total) by mouth daily. Patient not taking: Reported on 02/23/2018 01/09/18   Rodolph Bongorey, Evan S, MD    Family History Family History  Problem Relation Age of Onset  . Crohn's disease Father   . Stroke Maternal Uncle   . Cancer Paternal Grandmother   . Diabetes Paternal Grandmother     Social History Social History   Tobacco Use  . Smoking status: Former Smoker    Packs/day: 0.50    Years: 1.00    Pack years: 0.50  . Smokeless tobacco: Never Used  Substance Use Topics  . Alcohol use: No  . Drug use: No     Allergies   Patient has no known allergies.   Review of Systems Review  of Systems  Constitutional: Negative for chills, diaphoresis, fever and unexpected weight change.  Respiratory: Negative for shortness of breath.   Gastrointestinal: Positive for abdominal pain, diarrhea, nausea and vomiting. Negative for abdominal distention and constipation.  Endocrine: Positive for polydipsia.  Genitourinary: Negative for difficulty urinating.  Musculoskeletal: Negative for arthralgias and myalgias.  Skin: Negative for rash and wound.  Allergic/Immunologic: Positive for immunocompromised state.  Neurological: Negative for dizziness and weakness.  Psychiatric/Behavioral: Negative  for confusion.  All other systems reviewed and are negative.    Physical Exam Updated Vital Signs BP (!) 158/92   Pulse 95   Temp 98.1 F (36.7 C) (Oral)   Resp 16   Ht 5\' 9"  (1.753 m)   Wt 54.4 kg   SpO2 100%   BMI 17.72 kg/m   Physical Exam  Constitutional: He is oriented to person, place, and time. No distress.  Thin appearing   HENT:  Head: Normocephalic and atraumatic.  Mouth/Throat: Oropharynx is clear and moist.  Cardiovascular: Normal rate, regular rhythm and normal heart sounds.  No murmur heard. Pulmonary/Chest: Effort normal and breath sounds normal.  Abdominal: Soft. Normal appearance and bowel sounds are normal. There is generalized tenderness. There is no CVA tenderness.  Neurological: He is alert and oriented to person, place, and time.  Skin: Skin is warm and dry. He is not diaphoretic.  Psychiatric: He has a normal mood and affect. His behavior is normal.  Nursing note and vitals reviewed.    ED Treatments / Results  Labs (all labs ordered are listed, but only abnormal results are displayed) Labs Reviewed  COMPREHENSIVE METABOLIC PANEL - Abnormal; Notable for the following components:      Result Value   Glucose, Bld 256 (*)    BUN 21 (*)    AST 51 (*)    ALT 112 (*)    All other components within normal limits  URINALYSIS, ROUTINE W REFLEX MICROSCOPIC - Abnormal; Notable for the following components:   Color, Urine AMBER (*)    APPearance HAZY (*)    Specific Gravity, Urine 1.032 (*)    Glucose, UA >=500 (*)    Ketones, ur 80 (*)    Protein, ur 100 (*)    Bacteria, UA MANY (*)    All other components within normal limits  CBG MONITORING, ED - Abnormal; Notable for the following components:   Glucose-Capillary 217 (*)    All other components within normal limits  CBG MONITORING, ED - Abnormal; Notable for the following components:   Glucose-Capillary 230 (*)    All other components within normal limits  URINE CULTURE  LIPASE, BLOOD  CBC    RAPID URINE DRUG SCREEN, HOSP PERFORMED  GC/CHLAMYDIA PROBE AMP ([place]) NOT AT [facility]    EKG None  Radiology No results found.  Procedures Procedures (including critical care time)  Medications Ordered in ED Medications  sodium chloride 0.9 % bolus 1,000 mL (0 mLs Intravenous Stopped 02/23/18 1752)  sodium chloride 0.9 % bolus 1,000 mL (0 mLs Intravenous Stopped 02/23/18 1929)  promethazine (PHENERGAN) injection 25 mg (25 mg Intravenous Given 02/23/18 1801)  morphine 4 MG/ML injection 4 mg (4 mg Intravenous Given 02/23/18 1804)  haloperidol lactate (HALDOL) injection 2 mg (2 mg Intravenous Given 02/23/18 1929)  insulin glargine (LANTUS) injection 30 Units (30 Units Subcutaneous Given 02/23/18 1944)  HYDROmorphone (DILAUDID) injection 1 mg (1 mg Intravenous Given 02/23/18 2040)     Initial Impression / Assessment and Plan / ED Course  I have reviewed the triage vital signs and the nursing notes.  Pertinent labs & imaging results that were available during my care of the patient were reviewed by me and considered in my medical decision making (see chart for details).  Clinical Course as of Feb 24 2044  Thu Feb 23, 2018  1920 27yo male with history of insulin dependent diabetes and gastroparesis, brought in by EMS for n/v/ abdominal pain, similar to previous episodes of gastroparesis. Patient states tried reglan previously with bad side effects. Given Zofran by EMS without relief. On exam, diffuse abdominal tenderness. Previous history of THC and cocaine use, UDS today is normal, CBG 217, UA with ketones, protein; CMP with elevated liver enzymes, normal Co2 and anion gap, CBC and lipase WNL. Patient was given 2 L Ns, phenergan and morphine, on recheck has ongoing non bloody emesis. Patient's regular insulin dose ordered as well as haldol for intractable vomiting.    [LM]  1927 Last CT abdomen/pelvis 10/2017, patient reports symptoms same as previous gastroparesis, will avoid CT at this  time and reassess.    [LM]  2043 Persistent nausea, vomiting and pain. Case discussed with Dr. Clarene Duke, recommends consult for admission for intractable vomiting.    [LM]    Clinical Course User Index [LM] Jeannie Fend, PA-C    Final Clinical Impressions(s) / ED Diagnoses   Final diagnoses:  Intractable vomiting with nausea, unspecified vomiting type  Generalized abdominal pain    ED Discharge Orders    None       Alden Hipp 02/23/18 2045    Lorre Nick, MD 02/24/18 [phone]

## 2018-02-23 NOTE — ED Triage Notes (Addendum)
Per EMS-Patient c/o mid abdominal pain, N/V since last night. Abdominal pain worse today. Patient reports history of gastroparesis. Zofran 4 mg IM Given prior to arrival to the ED.  CBG-217 in triage.

## 2018-02-23 NOTE — H&P (Signed)
TRH H&P   Patient Demographics:    Jake Howell, is a 27 y.o. male  MRN: [id number]   DOB - 11/16/1990  Admit Date - 02/23/2018  Outpatient Primary MD for the patient is Gregor Hams, MD  Referring MD/NP/PA:   Outpatient Specialists:   Patient coming from: home   Chief Complaint  Patient presents with  . Abdominal Pain  . Emesis      HPI:    Jake Howell  is a 27 y.o. male, w dm1, gastroparesis, esophagitis on EGD 09/05/2015,  apparently c/o n/v starting last nite and getting worse today as well as epigastric discomfort. Slight diarrhea (chronic).  Pt denies fever, chills, constipation, brbpr, black stool.    In Ed, pt continued to have n/v, even with phenergan , haldol and pain even after morphine.    Na 140, K 3.9, Bun 21, Creatinine 0.67 Glucose 256 Ast 51, Alt 112 Alk phos 101, T. Bili 1.2 Wbc 8.8, Hgb 14.7, lt 326  Urinalysis negative.  UDS negative  Pt will be admitted for refractory n/v, abd pain, probably related to gastroparesis as well as abnormal liver function.      Review of systems:    In addition to the HPI above, No Fever-chills, No Headache, No changes with Vision or hearing, No problems swallowing food or Liquids, No Chest pain, Cough or Shortness of Breath,  No Blood in stool or Urine, No dysuria, No new skin rashes or bruises, No new joints pains-aches,  No new weakness, tingling, numbness in any extremity, No recent weight gain or loss, No polyuria, polydypsia or polyphagia, No significant Mental Stressors.  A full 10 point Review of Systems was done, except as stated above, all other Review of Systems were negative.   With Past History of the following :    Past Medical History:  Diagnosis Date  . Depression   . Diabetes mellitus   . Gastroparesis   . Gastroparesis due to DM ([place]) 09/08/2015  . Neuropathy   . Scoliosis   .  Vitamin D deficiency       History reviewed. No pertinent surgical history.    Social History:     Social History   Tobacco Use  . Smoking status: Former Smoker    Packs/day: 0.50    Years: 1.00    Pack years: 0.50  . Smokeless tobacco: Never Used  Substance Use Topics  . Alcohol use: No     Lives - at home  Mobility - walks by self   Family History :     Family History  Problem Relation Age of Onset  . Crohn's disease Father   . Stroke Maternal Uncle   . Cancer Paternal Grandmother   . Diabetes Paternal Grandmother        Home Medications:   Prior to Admission medications   Medication Sig Start Date End Date Taking?  Authorizing Provider  insulin aspart (NOVOLOG FLEXPEN) 100 UNIT/ML FlexPen SSI 3x daily with meals Patient taking differently: Inject 0-10 Units into the skin 3 (three) times daily with meals. SSI 3x daily with meals 01/09/18  Yes Gregor Hams, MD  insulin degludec (TRESIBA FLEXTOUCH) 100 UNIT/ML SOPN FlexTouch Pen Inject 0.3 mLs (30 Units total) into the skin daily. 01/09/18  Yes Gregor Hams, MD  mirtazapine (REMERON) 7.5 MG tablet Take 1 tablet (7.5 mg total) by mouth at bedtime. 01/09/18  Yes Gregor Hams, MD  AMBULATORY NON FORMULARY MEDICATION Test strips for 4x daily testing.  One touch ultra. E10.40 uncontrolled DM1 01/09/18   Gregor Hams, MD  Insulin Pen Needle (ASSURE ID SAFETY PEN NEEDLES) 31G X 5 MM MISC 1 each by Does not apply route 4 (four) times daily. 01/09/18   Gregor Hams, MD  lisinopril (PRINIVIL,ZESTRIL) 5 MG tablet Take 1 tablet (5 mg total) by mouth daily. Patient not taking: Reported on 02/23/2018 01/09/18   Gregor Hams, MD     Allergies:    No Known Allergies   Physical Exam:   Vitals  Blood pressure (!) 158/92, pulse 95, temperature 98.1 F (36.7 C), temperature source Oral, resp. rate 16, height 5' 9"  (1.753 m), weight 54.4 kg, SpO2 100 %.   1. General  lying in bed in NAD,    2. Normal affect and insight,  Not Suicidal or Homicidal, Awake Alert, Oriented X 3.  3. No F.N deficits, ALL C.Nerves Intact, Strength 5/5 all 4 extremities, Sensation intact all 4 extremities, Plantars down going.  4. Ears and Eyes appear Normal, Conjunctivae clear, PERRLA. Moist Oral Mucosa.  5. Supple Neck, No JVD, No cervical lymphadenopathy appriciated, No Carotid Bruits.  6. Symmetrical Chest wall movement, Good air movement bilaterally, CTAB.  7. RRR, No Gallops, Rubs or Murmurs, No Parasternal Heave.  8. Positive Bowel Sounds, Abdomen Soft, No tenderness, No organomegaly appriciated,No rebound -guarding or rigidity.  9.  No Cyanosis, Normal Skin Turgor, No Skin Rash or Bruise.  10. Good muscle tone,  joints appear normal , no effusions, Normal ROM.  11. No Palpable Lymph Nodes in Neck or Axillae      Data Review:    CBC Recent Labs  Lab 02/23/18 1450  WBC 8.8  HGB 14.7  HCT 42.7  PLT 326  MCV 81.5  MCH 28.1  MCHC 34.4  RDW 13.0   ------------------------------------------------------------------------------------------------------------------  Chemistries  Recent Labs  Lab 02/23/18 1450  NA 140  K 3.9  CL 102  CO2 26  GLUCOSE 256*  BUN 21*  CREATININE 0.67  CALCIUM 9.5  AST 51*  ALT 112*  ALKPHOS 101  BILITOT 1.2   ------------------------------------------------------------------------------------------------------------------ estimated creatinine clearance is 108.6 mL/min (by C-G formula based on SCr of 0.67 mg/dL). ------------------------------------------------------------------------------------------------------------------ No results for input(s): TSH, T4TOTAL, T3FREE, THYROIDAB in the last 72 hours.  Invalid input(s): FREET3  Coagulation profile No results for input(s): INR, PROTIME in the last 168 hours. ------------------------------------------------------------------------------------------------------------------- No results for input(s): DDIMER in the last  72 hours. -------------------------------------------------------------------------------------------------------------------  Cardiac Enzymes No results for input(s): CKMB, TROPONINI, MYOGLOBIN in the last 168 hours.  Invalid input(s): CK ------------------------------------------------------------------------------------------------------------------ No results found for: BNP   ---------------------------------------------------------------------------------------------------------------  Urinalysis    Component Value Date/Time   COLORURINE AMBER (A) 02/23/2018 1700   APPEARANCEUR HAZY (A) 02/23/2018 1700   LABSPEC 1.032 (H) 02/23/2018 1700   PHURINE 8.0 02/23/2018 1700   GLUCOSEU >=500 (A) 02/23/2018 1700   HGBUR NEGATIVE  02/23/2018 1700   BILIRUBINUR NEGATIVE 02/23/2018 1700   KETONESUR 80 (A) 02/23/2018 1700   PROTEINUR 100 (A) 02/23/2018 1700   UROBILINOGEN 0.2 02/15/2012 0108   NITRITE NEGATIVE 02/23/2018 1700   LEUKOCYTESUR NEGATIVE 02/23/2018 1700    ----------------------------------------------------------------------------------------------------------------   Imaging Results:    No results found.     Assessment & Plan:    Principal Problem:   Nausea & vomiting Active Problems:   Abdominal pain    Nausea and emesis Clear liquid Phenergan 12.34m iv q6h prn  Zofran 450miv q6h prn  pepcid 2035mv bid If not improving consider gastric emptying study If not improving consider GI consult  Abdominal pain RUQ ultrasound r/o cholecystitis pepcid 63m69m bid Dilaudid 1mg 66mq4h prn severe pain  Dm1 Hold Long acting insulin since on clear liquids fsbs q4h, ISS  Gerd/ h/o esophagitis Pepcid iv    DVT Prophylaxis  Lovenox - SCDs   AM Labs Ordered, also please review Full Orders  Family Communication: Admission, patients condition and plan of care including tests being ordered have been discussed with the patient  who indicate understanding and  agree with the plan and Code Status.  Code Status  FULL CODE  Likely DC to  home  Condition GUARDED    Consults called: none  Admission status: observation, pt requires admission to hopsital for refractory n/v, requiring iv nausea medications as well as IVF for dehydration.  Pt will likely need observation stay alone but if his nausea and vomitting or abdominal worsening might need inpatient stay, esp if RUQ ultrasound is abnormal    Time spent in minutes : 70   JamesJani Gravelon 02/23/2018 at 8:41 PM  Between 7am to 7pm - Pager - [phone] 7pm go to [email or web address] - password [email or web address] [facility] - Office  [phone]

## 2018-02-23 NOTE — ED Notes (Signed)
Patient given urinal and told to call out when sample is ready.

## 2018-02-24 DIAGNOSIS — R112 Nausea with vomiting, unspecified: Secondary | ICD-10-CM | POA: Diagnosis not present

## 2018-02-24 LAB — COMPREHENSIVE METABOLIC PANEL
ALBUMIN: 3.3 g/dL — AB (ref 3.5–5.0)
ALK PHOS: 73 U/L (ref 38–126)
ALT: 77 U/L — AB (ref 0–44)
AST: 34 U/L (ref 15–41)
Anion gap: 10 (ref 5–15)
BILIRUBIN TOTAL: 1.3 mg/dL — AB (ref 0.3–1.2)
BUN: 20 mg/dL (ref 6–20)
CALCIUM: 8.5 mg/dL — AB (ref 8.9–10.3)
CO2: 22 mmol/L (ref 22–32)
CREATININE: 0.56 mg/dL — AB (ref 0.61–1.24)
Chloride: 111 mmol/L (ref 98–111)
GFR calc Af Amer: >60 mL/min (ref >60)
GLUCOSE: 71 mg/dL (ref 70–99)
POTASSIUM: 3.6 mmol/L (ref 3.5–5.1)
SODIUM: 143 mmol/L (ref 135–145)
TOTAL PROTEIN: 6 g/dL — AB (ref 6.5–8.1)

## 2018-02-24 LAB — CBC
HCT: 37 % — ABNORMAL LOW (ref 39.0–52.0)
Hemoglobin: 12.3 g/dL — ABNORMAL LOW (ref 13.0–17.0)
MCH: 27.8 pg (ref 26.0–34.0)
MCHC: 33.2 g/dL (ref 30.0–36.0)
MCV: 83.5 fL (ref 78.0–100.0)
PLATELETS: 301 10*3/uL (ref 150–400)
RBC: 4.43 MIL/uL (ref 4.22–5.81)
RDW: 13.2 % (ref 11.5–15.5)
WBC: 8.4 10*3/uL (ref 4.0–10.5)

## 2018-02-24 LAB — GLUCOSE, CAPILLARY
GLUCOSE-CAPILLARY: 145 mg/dL — AB (ref 70–99)
GLUCOSE-CAPILLARY: 59 mg/dL — AB (ref 70–99)
GLUCOSE-CAPILLARY: 125 mg/dL — AB (ref 70–99)
Glucose-Capillary: 84 mg/dL (ref 70–99)
Glucose-Capillary: 170 mg/dL — ABNORMAL HIGH (ref 70–99)
Glucose-Capillary: 204 mg/dL — ABNORMAL HIGH (ref 70–99)
Glucose-Capillary: 163 mg/dL — ABNORMAL HIGH (ref 70–99)
Glucose-Capillary: 231 mg/dL — ABNORMAL HIGH (ref 70–99)

## 2018-02-24 LAB — GC/CHLAMYDIA PROBE AMP (~~LOC~~) NOT AT ARMC
Chlamydia: NEGATIVE
Neisseria Gonorrhea: NEGATIVE

## 2018-02-24 LAB — HIV ANTIBODY (ROUTINE TESTING W REFLEX): HIV Screen 4th Generation wRfx: NONREACTIVE

## 2018-02-24 MED ORDER — INSULIN GLARGINE 100 UNIT/ML ~~LOC~~ SOLN
15.0000 [IU] | Freq: Once | SUBCUTANEOUS | Status: AC
  Administered 2018-02-24: 15 [IU] via SUBCUTANEOUS
  Filled 2018-02-24: qty 0.15

## 2018-02-24 MED ORDER — INSULIN ASPART 100 UNIT/ML ~~LOC~~ SOLN
0.0000 [IU] | SUBCUTANEOUS | Status: DC
  Administered 2018-02-24: 3 [IU] via SUBCUTANEOUS
  Administered 2018-02-24: 2 [IU] via SUBCUTANEOUS
  Administered 2018-02-24 – 2018-02-25 (×2): 1 [IU] via SUBCUTANEOUS
  Administered 2018-02-25: 2 [IU] via SUBCUTANEOUS
  Administered 2018-02-25: 5 [IU] via SUBCUTANEOUS
  Administered 2018-02-25: 1 [IU] via SUBCUTANEOUS
  Administered 2018-02-26: 3 [IU] via SUBCUTANEOUS
  Administered 2018-02-26: 5 [IU] via SUBCUTANEOUS
  Administered 2018-02-26: 3 [IU] via SUBCUTANEOUS
  Administered 2018-02-26: 1 [IU] via SUBCUTANEOUS
  Administered 2018-02-26: 2 [IU] via SUBCUTANEOUS
  Administered 2018-02-27: 7 [IU] via SUBCUTANEOUS
  Administered 2018-02-27: 5 [IU] via SUBCUTANEOUS
  Administered 2018-02-27: 3 [IU] via SUBCUTANEOUS
  Administered 2018-02-27: 2 [IU] via SUBCUTANEOUS
  Administered 2018-02-27 – 2018-02-28 (×2): 5 [IU] via SUBCUTANEOUS
  Administered 2018-02-28: 7 [IU] via SUBCUTANEOUS
  Administered 2018-02-28: 3 [IU] via SUBCUTANEOUS

## 2018-02-24 MED ORDER — HYDROCODONE-ACETAMINOPHEN 5-325 MG PO TABS
1.0000 | ORAL_TABLET | ORAL | Status: DC | PRN
  Administered 2018-02-24 – 2018-02-27 (×11): 1 via ORAL
  Filled 2018-02-24 (×12): qty 1

## 2018-02-24 MED ORDER — SUCRALFATE 1 GM/10ML PO SUSP
1.0000 g | Freq: Three times a day (TID) | ORAL | Status: DC
  Administered 2018-02-24 – 2018-02-27 (×15): 1 g via ORAL
  Filled 2018-02-24 (×16): qty 10

## 2018-02-24 MED ORDER — TRAZODONE HCL 50 MG PO TABS
50.0000 mg | ORAL_TABLET | Freq: Once | ORAL | Status: AC
  Administered 2018-02-24: 50 mg via ORAL
  Filled 2018-02-24: qty 1

## 2018-02-24 NOTE — Progress Notes (Addendum)
[facility]ITALIST PROGRESS NOTE  Jake Howell SHF:[id number] DOB: [birth year] DOA: 02/23/2018 PCP: Gregor Hams, MD   Narrative: 26 year old male DM TY 1 previous insulin pump in 5027 complicated by gastroparesis nephropathy neuropathy Bipolar, and insomnia, severe malnutrition BMI 16, marijuana use  Admitted in the past and admitted 8/29 with presumed gastroparesis epigastric pain-previously used Reglan and used to follow with an endocrinologist at [facility].  Note that 08/2015 had grade a esophagitis followed by digestive health specialists in [place] and he was recommended to quit using marijuana at that time given possible cannabinoid hyperemesis syndrome  On admission found to have slightly elevated ALT to AST, elevated alk phos and continue to have nausea vomiting even with Phenergan Haldol and abdominal pain refractory to morphine and was admitted  A & Plan DM gastroparesis-states "funny feeling in legs" with reglan-not taking it consistently in the outpatient setting-states however he can get his other meds Adding Carafate, can use Vicodin as first choice, soft diet--discontinue Phenergan, discontinue Haldol  ?  Gallbladder pathology-ultrasound pending  DM ty 1-can get meds-complicated this admit by hypo 50--cut back lantus 30-->15-CBGs ridging from 59-204 Monitor-does need retinopathy screening as an outpatient Lisinopril 5 on hold for now  Prior marijuana use-claims to have quit 6 months ago  Bipolar/insomnia-continue Remeron 7.5 at bedtime  Psychosocial stressors-lives in transitional housing cannot afford meds-does work however  Presumed, no family, inpatient, await resolution of symptoms prior to discharge    Verlon Au, MD  [facility] Direct contact: [phone] --Via [place]  --[email or web address]; password [email or web address]  7PM-7AM contact night coverage as above 02/24/2018, 7:59 AM  LOS: 0 days    Consultants:  n  Procedures:  n  Antimicrobials:  n  Interval history/Subjective: Awake pleasant alert Tolerating antibiotics diet and thinks can eat more  Objective:  Vitals:  Vitals:   02/23/18 2137 02/24/18 0633  BP: 126/78 128/71  Pulse: (!) 102 95  Resp: 18 18  Temp: 99.1 F (37.3 C) (!) 97.4 F (36.3 C)  SpO2: 100% 100%    Exam:  My NCAT Chest clear Abdomen nontender no rebound scaphoid No lower extremity edema S1-S2 no murmur Neurologically intact Skin intact  I have personally reviewed the following:   Labs:  BUN/creatinine 20/0.5, AST/ALT 34/77 total bili 1.3  Hemoglobin 12.3 MCV 83 white count 8  Imaging studies:  None  Medical tests:  n   Test discussed with performing physician:  n  Decision to obtain old records:  n  Review and summation of old records:  n  Scheduled Meds: . enoxaparin (LOVENOX) injection  40 mg Subcutaneous Q24H  . insulin aspart  0-9 Units Subcutaneous Q4H  . insulin glargine  15 Units Subcutaneous Once  . mirtazapine  7.5 mg Oral QHS  . sucralfate  1 g Oral TID WC & HS   Continuous Infusions: . sodium chloride 100 mL/hr at 02/24/18 0759  . famotidine (PEPCID) IV Stopped (02/23/18 2229)    Principal Problem:   Nausea & vomiting Active Problems:   Type 1 diabetes with neurological complication (HCC)   Abdominal pain   LOS: 0 days

## 2018-02-24 NOTE — Clinical Social Work Note (Signed)
Clinical Social Work Assessment  Patient Details  Name: Jake Howell MRN: [id number] Date of Birth: 1991-06-08  Date of referral:  02/24/18               Reason for consult:  Discharge Planning                Permission sought to share information with:    Permission granted to share information::     Name::        Agency::     Relationship::     Contact Information:     Housing/Transportation Living arrangements for the past 2 months:  Single Family Home([facility]) Source of Information:  Patient Patient Interpreter Needed:  None Criminal Activity/Legal Involvement Pertinent to Current Situation/Hospitalization:  No - Comment as needed Significant Relationships:  Other(Comment)(Unknown - patient did not disclose) Lives with:  Facility Resident Do you feel safe going back to the place where you live?  Yes Need for family participation in patient care:  No (Coment)  Care giving concerns:  Patient reported no care giving concerns. [facility] from [facility]. CSW consulted to assist with discharge planning.   Social Worker assessment / plan:  CSW spoke with patient at bedside regarding discharge planning. Patient reported that he has been residing at a transitional living facility for a couple months and that things have been going fine. Patient reported that he is currently unemployed and does not having any source of income. Patient reported that he plans to return when he is medically stable. Patient reported that he will need assistance with transportation when he is ready for discharge.  CSW agreed to provide patient with bus pass when he is ready to discharge.   Employment status:  Unemployed Health and safety inspectornsurance information:  Managed Care PT Recommendations:  Not assessed at this time Information / Referral to community resources:  Other (Comment Required)(Patient reported no needs for resources)  Patient/Family's Response to care:   Patient appreciative of CSW visit.   Patient/Family's Understanding of and Emotional Response to Diagnosis, Current Treatment, and Prognosis:  Patient presented calm with minimal speech. Patient verbalized plan to return to transitional living program at discharge.   Emotional Assessment Appearance:  Appears stated age Attitude/Demeanor/Rapport:  Other (minimal speech) Affect (typically observed):  Appropriate, Calm Orientation:  Oriented to Self, Oriented to Place, Oriented to  Time, Oriented to Situation Alcohol / Substance use:  Not Applicable Psych involvement (Current and /or in the community):  No (Comment)  Discharge Needs  Concerns to be addressed:  Discharge Planning Concerns(Patient reports that he will need transportation at discharge) Readmission within the last 30 days:  No Current discharge risk:  None Barriers to Discharge:  Continued Medical Work up   USG CorporationKimberly L Dana Debo, LCSW 02/24/2018, 10:28 AM

## 2018-02-24 NOTE — Progress Notes (Signed)
Hypoglycemic Event  CBG: 59  Treatment: 15 GM carbohydrate snack  Symptoms: Shaky  Follow-up CBG: Time:0705 CBG Result:84  Possible Reasons for Event: Inadequate meal intake  Comments/MD notified:initiated nurse driven hypoglycemic protocol     [name] Y

## 2018-02-25 DIAGNOSIS — R112 Nausea with vomiting, unspecified: Secondary | ICD-10-CM | POA: Diagnosis not present

## 2018-02-25 LAB — BASIC METABOLIC PANEL
Anion gap: 10 (ref 5–15)
BUN: 9 mg/dL (ref 6–20)
CHLORIDE: 101 mmol/L (ref 98–111)
CO2: 26 mmol/L (ref 22–32)
CREATININE: 0.57 mg/dL — AB (ref 0.61–1.24)
Calcium: 8.8 mg/dL — ABNORMAL LOW (ref 8.9–10.3)
GFR calc Af Amer: >60 mL/min (ref >60)
GFR calc non Af Amer: >60 mL/min (ref >60)
GLUCOSE: 159 mg/dL — AB (ref 70–99)
Potassium: 4.2 mmol/L (ref 3.5–5.1)
SODIUM: 137 mmol/L (ref 135–145)

## 2018-02-25 LAB — URINE CULTURE

## 2018-02-25 LAB — GLUCOSE, CAPILLARY
GLUCOSE-CAPILLARY: 95 mg/dL (ref 70–99)
GLUCOSE-CAPILLARY: 128 mg/dL — AB (ref 70–99)
GLUCOSE-CAPILLARY: 286 mg/dL — AB (ref 70–99)
Glucose-Capillary: 139 mg/dL — ABNORMAL HIGH (ref 70–99)
Glucose-Capillary: 155 mg/dL — ABNORMAL HIGH (ref 70–99)
Glucose-Capillary: 83 mg/dL (ref 70–99)

## 2018-02-25 MED ORDER — SCOPOLAMINE 1 MG/3DAYS TD PT72
1.0000 | MEDICATED_PATCH | TRANSDERMAL | Status: DC
  Administered 2018-02-25 – 2018-02-28 (×2): 1.5 mg via TRANSDERMAL
  Filled 2018-02-25 (×3): qty 1

## 2018-02-25 MED ORDER — FAMOTIDINE 20 MG PO TABS
20.0000 mg | ORAL_TABLET | Freq: Two times a day (BID) | ORAL | Status: DC
  Administered 2018-02-25 – 2018-02-28 (×6): 20 mg via ORAL
  Filled 2018-02-25 (×6): qty 1

## 2018-02-25 NOTE — Progress Notes (Signed)
[facility]IST PROGRESS NOTE  Jake Howell UPB:[id number] DOB: October 20, 1990 DOA: 02/23/2018 PCP: Gregor Hams, MD   Narrative: 27 year old male DM TY 1 previous insulin pump in 8412 complicated by gastroparesis nephropathy neuropathy Bipolar, and insomnia, severe malnutrition BMI 16, marijuana use  Admitted in the past and admitted 8/29 with presumed gastroparesis epigastric pain-previously used Reglan and used to follow with an endocrinologist at [facility]  Note that 08/2015 had grade a esophagitis followed by digestive health specialists in [place] and he was recommended to quit using marijuana at that time given possible cannabinoid hyperemesis syndrome  On admission found to have slightly elevated ALT to AST, elevated alk phos and continue to have nausea vomiting even with Phenergan Haldol and abdominal pain refractory to morphine and was admitted  A & Plan DM gastroparesis-side effects with Reglan and unwilling to use--Continue  carafate, can use Vicodin as first choice, soft diet--discontinue Phenergan, discontinue Haldol  ?  Gallbladder pathology-ultrasound pending still have reordered the same unclear what happened  DM ty 1-can get meds-complicated this admit by hypo 50--cut back lantus 30-->15-CBGs 83/155  Prior marijuana use-claims to have quit 6 months ago  Bipolar/insomnia-continue Remeron 7.5 at bedtime  Psychosocial stressors-lives in transitional housing cannot afford meds-does work however  Presumed, no family, inpatient, await resolution of symptoms prior to discharge    Verlon Au, MD  [facility] Direct contact: [phone] --Via [place]  --[email or web address]; password [email or web address]  7PM-7AM contact night coverage as above 02/25/2018, 10:13 AM  LOS: 0 days   Consultants:  n  Procedures:  n  Antimicrobials:  n  Interval history/Subjective:  Episodic emesis 100 cc last p.m. No chest pain no fever No nausea at present time Tolerating soft  diet and clear liquids   Objective:  Vitals:  Vitals:   02/24/18 1309 02/24/18 2016  BP: 127/77 128/82  Pulse: 77 72  Resp: 16 12  Temp: 98.3 F (36.8 C) 98.9 F (37.2 C)  SpO2: 100% 100%    Exam:  EOMI NCAT Chest clear Abdomen decreased boswel sounds No lower extremity edema S1-S2 no murmur Neurologically intact Skin intact  I have personally reviewed the following:   Labs:  BUN/creatinine 20/0.5--->9/0.57  Hb 14-->12.3  Imaging studies:  None  Medical tests:  n   Test discussed with performing physician:  n  Decision to obtain old records:  n  Review and summation of old records:  n  Scheduled Meds: . enoxaparin (LOVENOX) injection  40 mg Subcutaneous Q24H  . insulin aspart  0-9 Units Subcutaneous Q4H  . mirtazapine  7.5 mg Oral QHS  . scopolamine  1 patch Transdermal Q72H  . sucralfate  1 g Oral TID WC & HS   Continuous Infusions: . famotidine (PEPCID) IV 20 mg (02/25/18 0857)    Principal Problem:   Nausea & vomiting Active Problems:   Type 1 diabetes with neurological complication (HCC)   Abdominal pain   LOS: 0 days

## 2018-02-25 NOTE — Plan of Care (Signed)
  Problem: Health Behavior/Discharge Planning: Goal: Ability to manage health-related needs will improve Outcome: Progressing   Problem: Education: Goal: Knowledge of General Education information will improve Description: Including pain rating scale, medication(s)/side effects and non-pharmacologic comfort measures Outcome: Progressing   Problem: Clinical Measurements: Goal: Respiratory complications will improve Outcome: Progressing   

## 2018-02-25 NOTE — Progress Notes (Signed)
Report received from L.Clark,RN. No change in assessment. Continue plan of care. Jake Howell, Jake Howell

## 2018-02-26 DIAGNOSIS — G4700 Insomnia, unspecified: Secondary | ICD-10-CM | POA: Diagnosis present

## 2018-02-26 DIAGNOSIS — R112 Nausea with vomiting, unspecified: Secondary | ICD-10-CM | POA: Diagnosis not present

## 2018-02-26 DIAGNOSIS — R1084 Generalized abdominal pain: Secondary | ICD-10-CM | POA: Diagnosis present

## 2018-02-26 DIAGNOSIS — K219 Gastro-esophageal reflux disease without esophagitis: Secondary | ICD-10-CM | POA: Diagnosis present

## 2018-02-26 DIAGNOSIS — E1043 Type 1 diabetes mellitus with diabetic autonomic (poly)neuropathy: Secondary | ICD-10-CM | POA: Diagnosis present

## 2018-02-26 DIAGNOSIS — Z87891 Personal history of nicotine dependence: Secondary | ICD-10-CM | POA: Diagnosis not present

## 2018-02-26 DIAGNOSIS — K3184 Gastroparesis: Secondary | ICD-10-CM

## 2018-02-26 DIAGNOSIS — Z794 Long term (current) use of insulin: Secondary | ICD-10-CM | POA: Diagnosis not present

## 2018-02-26 DIAGNOSIS — E1143 Type 2 diabetes mellitus with diabetic autonomic (poly)neuropathy: Secondary | ICD-10-CM | POA: Diagnosis present

## 2018-02-26 DIAGNOSIS — F319 Bipolar disorder, unspecified: Secondary | ICD-10-CM | POA: Diagnosis present

## 2018-02-26 LAB — GLUCOSE, CAPILLARY
GLUCOSE-CAPILLARY: 158 mg/dL — AB (ref 70–99)
GLUCOSE-CAPILLARY: 228 mg/dL — AB (ref 70–99)
Glucose-Capillary: 144 mg/dL — ABNORMAL HIGH (ref 70–99)
Glucose-Capillary: 171 mg/dL — ABNORMAL HIGH (ref 70–99)
Glucose-Capillary: 220 mg/dL — ABNORMAL HIGH (ref 70–99)
Glucose-Capillary: 284 mg/dL — ABNORMAL HIGH (ref 70–99)
Glucose-Capillary: 48 mg/dL — ABNORMAL LOW (ref 70–99)
Glucose-Capillary: 55 mg/dL — ABNORMAL LOW (ref 70–99)

## 2018-02-26 LAB — BASIC METABOLIC PANEL
Anion gap: 9 (ref 5–15)
BUN: 7 mg/dL (ref 6–20)
CO2: 29 mmol/L (ref 22–32)
Calcium: 8.9 mg/dL (ref 8.9–10.3)
Chloride: 102 mmol/L (ref 98–111)
Creatinine, Ser: 0.57 mg/dL — ABNORMAL LOW (ref 0.61–1.24)
GFR calc Af Amer: >60 mL/min (ref >60)
GLUCOSE: 242 mg/dL — AB (ref 70–99)
POTASSIUM: 4.4 mmol/L (ref 3.5–5.1)
Sodium: 140 mmol/L (ref 135–145)

## 2018-02-26 LAB — CBC WITH DIFFERENTIAL/PLATELET
Basophils Absolute: 0 10*3/uL (ref 0.0–0.1)
Basophils Relative: 0 %
EOS PCT: 3 %
Eosinophils Absolute: 0.2 10*3/uL (ref 0.0–0.7)
HCT: 36.7 % — ABNORMAL LOW (ref 39.0–52.0)
Hemoglobin: 12.6 g/dL — ABNORMAL LOW (ref 13.0–17.0)
LYMPHS ABS: 2.6 10*3/uL (ref 0.7–4.0)
LYMPHS PCT: 44 %
MCH: 28.1 pg (ref 26.0–34.0)
MCHC: 34.3 g/dL (ref 30.0–36.0)
MCV: 81.7 fL (ref 78.0–100.0)
MONO ABS: 0.9 10*3/uL (ref 0.1–1.0)
Monocytes Relative: 15 %
Neutro Abs: 2.3 10*3/uL (ref 1.7–7.7)
Neutrophils Relative %: 38 %
PLATELETS: 282 10*3/uL (ref 150–400)
RBC: 4.49 MIL/uL (ref 4.22–5.81)
RDW: 12.8 % (ref 11.5–15.5)
WBC: 6 10*3/uL (ref 4.0–10.5)

## 2018-02-26 NOTE — Progress Notes (Signed)
[facility] H[facility]ITALIST PROGRESS NOTE  Jake Howell LEX:[id number] DOB: Nov 16, 1990 DOA: 02/23/2018 PCP: Gregor Hams, MD   Narrative: 27 year old male DM TY 1 previous insulin pump in 4496 complicated by gastroparesis nephropathy neuropathy Bipolar, and insomnia, severe malnutrition BMI 16, marijuana use  Admitted in the past and admitted 8/29 with presumed gastroparesis epigastric pain-previously used Reglan and used to follow with an endocrinologist at [facility].  Note that 08/2015 had grade a esophagitis followed by digestive health specialists in [place] and he was recommended to quit using marijuana at that time given possible cannabinoid hyperemesis syndrome  On admission found to have slightly elevated ALT to AST, elevated alk phos and continue to have nausea vomiting even with Phenergan Haldol and abdominal pain refractory to morphine and was admitted  A & Plan DM gastroparesis-side effects with Reglan and unwilling to use--Continue carafate, can use Vicodin for now, soft diet--discontinue Phenergan, discontinue Haldol-cont TD scopolamine short term improved-willing to eat solids today--will need to durably tolerate 2-3 meals so not quite ready for d/c at this time  ?  Gallbladder pathology-ultrasound neg for cholelithiasis  DM ty 1-can get meds-complicated this admit by hypo 548--cut back lantus 30-->15 and completely d/c on 8/31 pm given recurrent lows Monitor q 4 checks  Prior marijuana use-claims to have quit 6 months ago  Bipolar/insomnia-continue Remeron 7.5 at bedtime  Psychosocial stressors-lives in transitional housing cannot afford meds-does work however  Presumed, no family, inpatient, await resolution of symptoms prior to discharge    Verlon Au, MD  [facility] Direct contact: [phone] --Via [place]  --[email or web address]; password [email or web address]  7PM-7AM contact night coverage as above 02/26/2018, 1:13 PM  LOS: 0 days    Consultants:  n  Procedures:  n  Antimicrobials:  n  Interval history/Subjective:  No emesis-note low cbg this am needing Rx--was tremulous First meal is at bedside this am No cp No fever  No chills  Objective:  Vitals:  Vitals:   02/25/18 2025 02/26/18 0539  BP: 140/86 (!) 137/92  Pulse: 76 62  Resp: 17 20  Temp: 99.4 F (37.4 C) 97.9 F (36.6 C)  SpO2: 99% 100%    Exam:  EOMI NCAT Chest clear Abdomen soft, mild epig tender No lower extremity edema S1-S2 no murmur   I have personally reviewed the following:   Labs:  Hb 14-->12.3-->12.6  Imaging studies:  None  Medical tests:  n   Test discussed with performing physician:  n  Decision to obtain old records:  n  Review and summation of old records:  n  Scheduled Meds: . enoxaparin (LOVENOX) injection  40 mg Subcutaneous Q24H  . famotidine  20 mg Oral BID  . insulin aspart  0-9 Units Subcutaneous Q4H  . mirtazapine  7.5 mg Oral QHS  . scopolamine  1 patch Transdermal Q72H  . sucralfate  1 g Oral TID WC & HS   Continuous Infusions:   Principal Problem:   Nausea & vomiting Active Problems:   Type 1 diabetes with neurological complication (HCC)   Abdominal pain   LOS: 0 days

## 2018-02-26 NOTE — Plan of Care (Signed)
  Problem: Health Behavior/Discharge Planning: Goal: Ability to manage health-related needs will improve Outcome: Progressing   Problem: Clinical Measurements: Goal: Ability to maintain clinical measurements within normal limits will improve Outcome: Progressing Goal: Will remain free from infection Outcome: Progressing Goal: Diagnostic test results will improve Outcome: Progressing   Problem: Pain Managment: Goal: General experience of comfort will improve Outcome: Progressing   Reeves Forth, RN 02/26/18 1:09 PM

## 2018-02-26 NOTE — Progress Notes (Signed)
Hypoglycemic Event  CBG: 55  Treatment: 15 GM carbohydrate snack  Symptoms: Sweaty, Shaky and Hungry  Follow-up CBG: Time: 0315 CBG Result:158  Possible Reasons for Event: Medication regimen  Comments/MD notified: X.Blount    Jake Howell

## 2018-02-26 NOTE — Progress Notes (Signed)
Hypoglycemic Event  CBG: 48  Treatment: 15 GM carbohydrate snack  Symptoms: Shaky and Hungry  Follow-up CBG: Time:0300 CBG Result: 55  Possible Reasons for Event: Medication regimen   Comments/MD notified: X.Blount    Nigel Wessman

## 2018-02-27 DIAGNOSIS — R112 Nausea with vomiting, unspecified: Secondary | ICD-10-CM

## 2018-02-27 LAB — GLUCOSE, CAPILLARY
GLUCOSE-CAPILLARY: 202 mg/dL — AB (ref 70–99)
GLUCOSE-CAPILLARY: 344 mg/dL — AB (ref 70–99)
Glucose-Capillary: 156 mg/dL — ABNORMAL HIGH (ref 70–99)
Glucose-Capillary: 195 mg/dL — ABNORMAL HIGH (ref 70–99)
Glucose-Capillary: 256 mg/dL — ABNORMAL HIGH (ref 70–99)
Glucose-Capillary: 289 mg/dL — ABNORMAL HIGH (ref 70–99)

## 2018-02-27 MED ORDER — DICYCLOMINE HCL 20 MG PO TABS
20.0000 mg | ORAL_TABLET | Freq: Three times a day (TID) | ORAL | Status: DC
  Administered 2018-02-27 (×2): 20 mg via ORAL
  Filled 2018-02-27 (×4): qty 1

## 2018-02-27 MED ORDER — ACETAMINOPHEN 325 MG PO TABS
650.0000 mg | ORAL_TABLET | Freq: Four times a day (QID) | ORAL | Status: DC | PRN
  Administered 2018-02-27: 650 mg via ORAL
  Filled 2018-02-27: qty 2

## 2018-02-27 MED ORDER — METOCLOPRAMIDE HCL 5 MG/ML IJ SOLN
10.0000 mg | Freq: Three times a day (TID) | INTRAMUSCULAR | Status: DC
  Filled 2018-02-27: qty 2

## 2018-02-27 MED ORDER — IBUPROFEN 200 MG PO TABS
400.0000 mg | ORAL_TABLET | ORAL | Status: DC | PRN
  Administered 2018-02-27: 400 mg via ORAL
  Filled 2018-02-27: qty 2

## 2018-02-27 MED ORDER — INSULIN DETEMIR 100 UNIT/ML ~~LOC~~ SOLN
12.0000 [IU] | Freq: Every day | SUBCUTANEOUS | Status: DC
  Administered 2018-02-27: 12 [IU] via SUBCUTANEOUS
  Filled 2018-02-27 (×2): qty 0.12

## 2018-02-27 NOTE — Progress Notes (Signed)
Inpatient Diabetes Program Recommendations  AACE/ADA: New Consensus Statement on Inpatient Glycemic Control (2015)  Target Ranges:  Prepandial:   less than 140 mg/dL      Peak postprandial:   less than 180 mg/dL (1-2 hours)      Critically ill patients:  140 - 180 mg/dL   Lab Results  Component Value Date   GLUCAP 344 (H) 02/27/2018   HGBA1C 11.2 (A) 01/09/2018    Review of Glycemic Control  Diabetes history: DM1 Outpatient Diabetes medications: Tresiba 30 units QD, Novolog 0-10 units tidwc Current orders for Inpatient glycemic control: Novolog 0-9 units Q4H.  HgbA1C - 11.2% - uncontrolled Diet is Regular. Needs basal insulin as pt is Type 1 and produces no insulin.  Inpatient Diabetes Program Recommendations:     Add Levemir 12 units QHS Change Novolog to 0-9 units tidwc and hs Change diet to CHO mod med  Spoke with pt regarding his HgbA1C of 11.2%. Pt states his HgbA1C has been elevated since going off insulin pump. Could not afford pump supplies. Checks blood sugars at home, but not on regular basis. Has been on Tresiba 30 units for awhile. States he sees endo at [facility]. Was tolerating diet today.  Pt has flat affect. Not really interested in talking about his diabetes. Will ask RN to allow pt to give his own insulin and stick his finger for blood sugar checks.   Diabetes Coordinator to f/u in am. Will page Hospitalist regarding adding basal insulin and changing diet. Discussed above with RN.   Thank you. Ailene Ards, RD, LDN, CDE Inpatient Diabetes Coordinator [phone]

## 2018-02-27 NOTE — Progress Notes (Signed)
PROGRESS NOTE    Jake Howell  PZW:[id number] DOB: 1990/07/04 DOA: 02/23/2018 PCP: Rodolph Bong, MD    Brief Narrative:  27 year old with past medical history relevant for type 1 diabetes on insulin pump complicated by diabetic gastroparesis, neuropathy, nephropathy, bipolar disorder, severe malnutrition who came in with recalcitrant nausea and vomiting.   Assessment & Plan:   Principal Problem:   Nausea & vomiting Active Problems:   Type 1 diabetes with neurological complication (HCC)   Abdominal pain   Diabetic gastroparesis (HCC)   #) Acute exacerbation of diabetic gastroparesis: -Scheduled medical provide 10 mg every 8 hours -Continue scopolamine patch every 72 hours - We will consider transitioning from metoclopramide to erythromycin he does not tolerate this  #) Abdominal pain: -Continue dicyclomine 20 mg 3 times daily -Continue famotidine -Continue sucralfate  #) Type 1 diabetes: - Continue sliding scale insulin - Restart home insulin degludec 15 units (patient is on 30 units at home)  #) Psych: -Continue mirtazapine 7.5 mg nightly  Fluids: Tolerating some p.o. Elect lites: Monitor and supplement Nutrition: Per above, nutrition consult  Disposition: Pending tolerating increased p.o. when able to hydrate himself  Full code    Consultants:   None  Procedures:   None  Antimicrobials:   None   Subjective: Unfortunately today the patient while tolerating some diet continues to have nausea and could only tolerate some liquids for lunch.  He continues to have significant amounts of diminished p.o. intake.  He denies any cough, congestion, rhinorrhea.  Objective: Vitals:   02/26/18 2121 02/26/18 2232 02/27/18 0406 02/27/18 0409  BP: (!) 144/106 140/86  (!) 162/95  Pulse: 79   61  Resp: 18   16  Temp: 98.3 F (36.8 C)   97.8 F (36.6 C)  TempSrc:      SpO2: 100%   100%  Weight:   50.6 kg   Height:        Intake/Output Summary (Last 24  hours) at 02/27/2018 1323 Last data filed at 02/27/2018 1159 Gross per 24 hour  Intake 230 ml  Output 50 ml  Net 180 ml   Filed Weights   02/25/18 0700 02/26/18 0539 02/27/18 0406  Weight: 52 kg 52.5 kg 50.6 kg    Examination:  General exam: Appears calm and comfortable  Respiratory system: Clear to auscultation. Respiratory effort normal. Cardiovascular system: Regular rate and rhythm, no murmurs Gastrointestinal system: Abdomen is nondistended, soft and nontender. No organomegaly or masses felt.  Diminished bowel sounds Central nervous system: Alert and oriented. No focal neurological deficits. Extremities: No lower extremity edema Skin: No rashes visible skin Psychiatry: Judgement and insight appear normal. Mood & affect appropriate.     Data Reviewed: I have personally reviewed following labs and imaging studies  CBC: Recent Labs  Lab 02/23/18 1450 02/24/18 0518 02/26/18 0509  WBC 8.8 8.4 6.0  NEUTROABS  --   --  2.3  HGB 14.7 12.3* 12.6*  HCT 42.7 37.0* 36.7*  MCV 81.5 83.5 81.7  PLT 326 301 282   Basic Metabolic Panel: Recent Labs  Lab 02/23/18 1450 02/24/18 0518 02/25/18 0438 02/26/18 0509  NA 140 143 137 140  K 3.9 3.6 4.2 4.4  CL 102 111 101 102  CO2 26 22 26 29   GLUCOSE 256* 71 159* 242*  BUN 21* 20 9 7   CREATININE 0.67 0.56* 0.57* 0.57*  CALCIUM 9.5 8.5* 8.8* 8.9   GFR: Estimated Creatinine Clearance: 101 mL/min (A) (by C-G formula based on SCr  of 0.57 mg/dL (L)). Liver Function Tests: Recent Labs  Lab 02/23/18 1450 02/24/18 0518  AST 51* 34  ALT 112* 77*  ALKPHOS 101 73  BILITOT 1.2 1.3*  PROT 7.6 6.0*  ALBUMIN 4.3 3.3*   Recent Labs  Lab 02/23/18 1450  LIPASE 26   No results for input(s): AMMONIA in the last 168 hours. Coagulation Profile: No results for input(s): INR, PROTIME in the last 168 hours. Cardiac Enzymes: No results for input(s): CKTOTAL, CKMB, CKMBINDEX, TROPONINI in the last 168 hours. BNP (last 3 results) No  results for input(s): PROBNP in the last 8760 hours. HbA1C: No results for input(s): HGBA1C in the last 72 hours. CBG: Recent Labs  Lab 02/26/18 1957 02/27/18 0015 02/27/18 0405 02/27/18 0732 02/27/18 1147  GLUCAP 171* 195* 156* 202* 344*   Lipid Profile: No results for input(s): CHOL, HDL, LDLCALC, TRIG, CHOLHDL, LDLDIRECT in the last 72 hours. Thyroid Function Tests: No results for input(s): TSH, T4TOTAL, FREET4, T3FREE, THYROIDAB in the last 72 hours. Anemia Panel: No results for input(s): VITAMINB12, FOLATE, FERRITIN, TIBC, IRON, RETICCTPCT in the last 72 hours. Sepsis Labs: No results for input(s): PROCALCITON, LATICACIDVEN in the last 168 hours.  Recent Results (from the past 240 hour(s))  Urine culture     Status: Abnormal   Collection Time: 02/23/18  5:00 PM  Result Value Ref Range Status   Specimen Description   Final    URINE, RANDOM Performed at [facility], [address], [place], [place] [address]    Special Requests   Final    NONE Performed at [facility], [address], [place], [place] [address]    Culture (A)  Final    <10,000 COLONIES/mL INSIGNIFICANT GROWTH Performed at [facility], [address], [place], [place] [address]    Report Status 02/25/2018 FINAL  Final         Radiology Studies: No results found.      Scheduled Meds: . dicyclomine  20 mg Oral TID AC  . enoxaparin (LOVENOX) injection  40 mg Subcutaneous Q24H  . famotidine  20 mg Oral BID  . insulin aspart  0-9 Units Subcutaneous Q4H  . mirtazapine  7.5 mg Oral QHS  . scopolamine  1 patch Transdermal Q72H  . sucralfate  1 g Oral TID WC & HS   Continuous Infusions:   LOS: 1 day    Time spent: 35    Delaine Lame, MD [facility]  If 7PM-7AM, please contact night-coverage [email or web address] Password [email or web address] 02/27/2018, 1:23 PM

## 2018-02-28 LAB — CBC
HCT: 40.3 % (ref 39.0–52.0)
Hemoglobin: 14.1 g/dL (ref 13.0–17.0)
MCH: 28.3 pg (ref 26.0–34.0)
MCHC: 35 g/dL (ref 30.0–36.0)
MCV: 80.8 fL (ref 78.0–100.0)
Platelets: 348 10*3/uL (ref 150–400)
RBC: 4.99 MIL/uL (ref 4.22–5.81)
RDW: 12.7 % (ref 11.5–15.5)
WBC: 7.5 10*3/uL (ref 4.0–10.5)

## 2018-02-28 LAB — COMPREHENSIVE METABOLIC PANEL
AST: 28 U/L (ref 15–41)
Albumin: 3.7 g/dL (ref 3.5–5.0)
Anion gap: 7 (ref 5–15)
CO2: 32 mmol/L (ref 22–32)
Calcium: 9.3 mg/dL (ref 8.9–10.3)
GFR calc Af Amer: >60 mL/min (ref >60)
Glucose, Bld: 94 mg/dL (ref 70–99)
Potassium: 4.7 mmol/L (ref 3.5–5.1)
Sodium: 138 mmol/L (ref 135–145)
Total Bilirubin: 0.6 mg/dL (ref 0.3–1.2)
Total Protein: 6.7 g/dL (ref 6.5–8.1)

## 2018-02-28 LAB — MAGNESIUM: Magnesium: 2.1 mg/dL (ref 1.7–2.4)

## 2018-02-28 LAB — GLUCOSE, CAPILLARY
GLUCOSE-CAPILLARY: 92 mg/dL (ref 70–99)
GLUCOSE-CAPILLARY: 251 mg/dL — AB (ref 70–99)
GLUCOSE-CAPILLARY: 217 mg/dL — AB (ref 70–99)
Glucose-Capillary: 101 mg/dL — ABNORMAL HIGH (ref 70–99)
Glucose-Capillary: 301 mg/dL — ABNORMAL HIGH (ref 70–99)

## 2018-02-28 LAB — COMPREHENSIVE METABOLIC PANEL WITH GFR
ALT: 53 U/L — ABNORMAL HIGH (ref 0–44)
Alkaline Phosphatase: 82 U/L (ref 38–126)
BUN: 19 mg/dL (ref 6–20)
Chloride: 99 mmol/L (ref 98–111)
Creatinine, Ser: 0.59 mg/dL — ABNORMAL LOW (ref 0.61–1.24)
GFR calc non Af Amer: >60 mL/min (ref >60)

## 2018-02-28 MED ORDER — DICYCLOMINE HCL 20 MG PO TABS: 20.0000 mg | ORAL_TABLET | Freq: Three times a day (TID) | ORAL | 0 refills | Status: AC

## 2018-02-28 MED ORDER — FAMOTIDINE 20 MG PO TABS: 20.0000 mg | ORAL_TABLET | Freq: Two times a day (BID) | ORAL | 0 refills | Status: AC

## 2018-02-28 MED ORDER — METOCLOPRAMIDE HCL 10 MG PO TABS: 10.0000 mg | ORAL_TABLET | Freq: Four times a day (QID) | ORAL | 1 refills | Status: DC | PRN

## 2018-02-28 NOTE — Discharge Instructions (Signed)
Gastroparesis °Gastroparesis, also called delayed gastric emptying, is a condition in which food takes longer than normal to empty from the stomach. The condition is usually long-lasting (chronic). °What are the causes? °This condition may be caused by: °· An endocrine disorder, such as hypothyroidism or diabetes. Diabetes is the most common cause of this condition. °· A nervous system disease, such as Parkinson disease or multiple sclerosis. °· Cancer, infection, or surgery of the stomach or vagus nerve. °· A connective tissue disorder, such as scleroderma. °· Certain medicines. ° °In most cases, the cause is not known. °What increases the risk? °This condition is more likely to develop in: °· People with certain disorders, including endocrine disorders, eating disorders, amyloidosis, and scleroderma. °· People with certain diseases, including Parkinson disease or multiple sclerosis. °· People with cancer or infection of the stomach or vagus nerve. °· People who have had surgery on the stomach or vagus nerve. °· People who take certain medicines. °· Women. ° °What are the signs or symptoms? °Symptoms of this condition include: °· An early feeling of fullness when eating. °· Nausea. °· Weight loss. °· Vomiting. °· Heartburn. °· Abdominal bloating. °· Inconsistent blood glucose levels. °· Lack of appetite. °· Acid from the stomach coming up into the esophagus (gastroesophageal reflux). °· Spasms of the stomach. ° °Symptoms may come and go. °How is this diagnosed? °This condition is diagnosed with tests, such as: °· Tests that check how long it takes food to move through the stomach and intestines. These tests include: °? Upper gastrointestinal (GI) series. In this test, X-rays of the intestines are taken after you drink a liquid. The liquid makes the intestines show up better on the X-rays. °? Gastric emptying scintigraphy. In this test, scans are taken after you eat food that contains a small amount of radioactive  material. °? Wireless capsule GI monitoring system. This test involves swallowing a capsule that records information about movement through the stomach. °· Gastric manometry. This test measures electrical and muscular activity in the stomach. It is done with a thin tube that is passed down the throat and into the stomach. °· Endoscopy. This test checks for abnormalities in the lining of the stomach. It is done with a long, thin tube that is passed down the throat and into the stomach. °· An ultrasound. This test can help rule out gallbladder disease or pancreatitis as a cause of your symptoms. It uses sound waves to take pictures of the inside of your body. ° °How is this treated? °There is no cure for gastroparesis. This condition may be managed with: °· Treatment of the underlying condition causing the gastroparesis. °· Lifestyle changes, including exercise and dietary changes. Dietary changes can include: °? Changes in what and when you eat. °? Eating smaller meals more often. °? Eating low-fat foods. °? Eating low-fiber forms of high-fiber foods, such as cooked vegetables instead of raw vegetables. °? Having liquid foods in place of solid foods. Liquid foods are easier to digest. °· Medicines. These may be given to control nausea and vomiting and to stimulate stomach muscles. °· Getting food through a feeding tube. This may be done in severe cases. °· A gastric neurostimulator. This is a device that is inserted into the body with surgery. It helps improve stomach emptying and control nausea and vomiting. ° °Follow these instructions at home: °· Follow your health care provider's instructions about exercise and diet. °· Take medicines only as directed by your health care provider. °Contact a   health care provider if: °· Your symptoms do not improve with treatment. °· You have new symptoms. °Get help right away if: °· You have severe abdominal pain that does not improve with treatment. °· You have nausea that does  not go away. °· You cannot keep fluids down. °This information is not intended to replace advice given to you by your health care provider. Make sure you discuss any questions you have with your health care provider. °Document Released: 06/14/2005 Document Revised: 11/20/2015 Document Reviewed: 06/10/2014 °Elsevier Interactive Patient Education © 2018 [employer] Inc[employer] ° °

## 2018-02-28 NOTE — Care Management Note (Signed)
Case Management Note  Patient Details  Name: Jake Howell MRN: [id number] Date of Birth: 08-20-1990  Subjective/Objective:d/c home. No CM needs.                    Action/Plan:d/c home.   Expected Discharge Date:  (unknown)               Expected Discharge Plan:  Home/Self Care  In-House Referral:     Discharge planning Services  CM Consult  Post Acute Care Choice:    Choice offered to:     DME Arranged:    DME Agency:     HH Arranged:    HH Agency:     Status of Service:  Completed, signed off  If discussed at [employer] of Stay Meetings, dates discussed:    Additional Comments:  Lanier Clam, RN 02/28/2018, 10:16 AM

## 2018-02-28 NOTE — Progress Notes (Signed)
Inpatient Diabetes Program Recommendations  AACE/ADA: New Consensus Statement on Inpatient Glycemic Control (2015)  Target Ranges:  Prepandial:   less than 140 mg/dL      Peak postprandial:   less than 180 mg/dL (1-2 hours)      Critically ill patients:  140 - 180 mg/dL   Results for ZAHMIR, STELLHORN (MRN [id number]) as of 02/28/2018 12:21  Ref. Range 02/28/2018 04:48 02/28/2018 05:42 02/28/2018 07:43 02/28/2018 11:51  Glucose-Capillary Latest Ref Range: 70 - 99 mg/dL 975 (H) 92 300 (H) 511 (H)   Review of Glycemic Control  Diabetes history: DM1 Outpatient Diabetes medications: Tresiba 30 units QD, Novolog 0-10 units tidwc Current orders for Inpatient glycemic control: Novolog 0-9 units Q4H, Lantus 12 units  HgbA1C - 11.2% - uncontrolled Diet is Carb Modified Sees endo at [facility].   Inpatient Diabetes Program Recommendations:     Consider increasing Levemir to 15-18 units.    Thank you. Christena Deem RN, MSN, BC-ADM Inpatient Diabetes Coordinator Team Pager [phone] (8a-5p)

## 2018-02-28 NOTE — Discharge Summary (Signed)
Physician Discharge Summary  Jake Howell ZOX:[id number] DOB: 04-22-1991 DOA: 02/23/2018  PCP: Rodolph Bong, MD  Admit date: 02/23/2018 Discharge date: 02/28/2018  Admitted From: Home) Disposition: Home  Recommendations for Outpatient Follow-up:  1. Follow up with PCP in 1-2 weeks 2. Please obtain BMP/CBC in one week   Home Health: No Equipment/Devices: None  Discharge Condition: stable CODE STATUS: FULL Diet recommendation: Carb Modified   Brief/Interim Summary:  #) Diabetic gastroparesis flare: Patient was admitted with nausea and vomiting and found to have likely diabetic gastroparesis flare.  He was given scheduled prokinetics and antiemetics and IV fluids and his diet was advanced to a carb restricted diet which he tolerated.  He was discharged home on as needed metoclopramide and teaching on gastroparesis diet.  #) Type 1 diabetes on insulin: Patient's home insulin regimen was held.  He was started on Levemir and sliding scale insulin here.  He was told to follow-up with endocrinologist as an outpatient to restart his insulin pump.  #) Abdominal pain: This was thought to be secondary to repeated episodes of retching.  He was given dicyclomine, famotidine, sucralfate with resolution of his abdominal pain.  #): Patient was continued on home mirtazapine.  Discharge Diagnoses:  Principal Problem:   Nausea & vomiting Active Problems:   Type 1 diabetes with neurological complication (HCC)   Abdominal pain   Diabetic gastroparesis [facility])    Discharge Instructions  Discharge Instructions    Call MD for:  difficulty breathing, headache or visual disturbances   Complete by:  As directed    Call MD for:  hives   Complete by:  As directed    Call MD for:  persistant dizziness or light-headedness   Complete by:  As directed    Call MD for:  persistant nausea and vomiting   Complete by:  As directed    Call MD for:  redness, tenderness, or signs of infection (pain, swelling,  redness, odor or green/yellow discharge around incision site)   Complete by:  As directed    Call MD for:  severe uncontrolled pain   Complete by:  As directed    Call MD for:  temperature >100.4   Complete by:  As directed    Diet - low sodium heart healthy   Complete by:  As directed    Discharge instructions   Complete by:  As directed    Please follow-up with your primary care doctor in 1 to 2 weeks.   Increase activity slowly   Complete by:  As directed      Allergies as of 02/28/2018   No Known Allergies     Medication List    STOP taking these medications   lisinopril 5 MG tablet Commonly known as:  PRINIVIL,ZESTRIL     TAKE these medications   AMBULATORY NON FORMULARY MEDICATION Test strips for 4x daily testing.  One touch ultra. E10.40 uncontrolled DM1   dicyclomine 20 MG tablet Commonly known as:  BENTYL Take 1 tablet (20 mg total) by mouth 3 (three) times daily before meals for 20 days.   famotidine 20 MG tablet Commonly known as:  PEPCID Take 1 tablet (20 mg total) by mouth 2 (two) times daily.   insulin aspart 100 UNIT/ML FlexPen Commonly known as:  NOVOLOG SSI 3x daily with meals What changed:    how much to take  how to take this  when to take this   insulin degludec 100 UNIT/ML Sopn FlexTouch Pen Commonly known as:  TRESIBA Inject 0.3 mLs (30 Units total) into the skin daily.   Insulin Pen Needle 31G X 5 MM Misc 1 each by Does not apply route 4 (four) times daily.   metoCLOPramide 10 MG tablet Commonly known as:  REGLAN Take 1 tablet (10 mg total) by mouth every 6 (six) hours as needed for up to 14 days for nausea or vomiting.   mirtazapine 7.5 MG tablet Commonly known as:  REMERON Take 1 tablet (7.5 mg total) by mouth at bedtime.       No Known Allergies  Consultations:  None   Procedures/Studies: US Abdomen Limited Ruq  Result Date: 02/23/2018 CLINICAL DATA:  27 year old male with abnormal liver function test. EXAM:  ULTRASOUND ABDOMEN LIMITED RIGHT UPPER QUADRANT COMPARISON:  None FINDINGS: Gallbladder: No gallstones or wall thickening visualized. No sonographic Murphy sign noted by sonographer. Common bile duct: Diameter: 3 mm Liver: No focal lesion identified. Within normal limits in parenchymal echogenicity. Portal vein is patent on color Doppler imaging with normal direction of blood flow towards the liver. IMPRESSION: Unremarkable right upper quadrant ultrasound. Electronically Signed   By: Elgie Collard M.D.   On: 02/23/2018 21:14     Subjective:   Discharge Exam: Vitals:   02/27/18 2046 02/28/18 0449  BP: 120/86 (!) 133/95  Pulse: 92 62  Resp: 18 18  Temp: 98.8 F (37.1 C) 98.1 F (36.7 C)  SpO2: 100% 100%   Vitals:   02/27/18 0409 02/27/18 1348 02/27/18 2046 02/28/18 0449  BP: (!) 162/95 (!) 135/94 120/86 (!) 133/95  Pulse: 61 88 92 62  Resp: 16 18 18 18   Temp: 97.8 F (36.6 C) 98.7 F (37.1 C) 98.8 F (37.1 C) 98.1 F (36.7 C)  TempSrc:  Oral Oral Oral  SpO2: 100% 100% 100% 100%  Weight:    50.8 kg  Height:        General: Pt is alert, awake, not in acute distress Cardiovascular: RRR,  no rubs, no gallops Respiratory: CTA bilaterally, no wheezing, no rhonchi Abdominal: Soft, NT, ND, bowel sounds + Extremities: no edema    The results of significant diagnostics from this hospitalization (including imaging, microbiology, ancillary and laboratory) are listed below for reference.     Microbiology: Recent Results (from the past 240 hour(s))  Urine culture     Status: Abnormal   Collection Time: 02/23/18  5:00 PM  Result Value Ref Range Status   Specimen Description   Final    URINE, RANDOM Performed at [facility], [address] [place], [place] [address]    Special Requests   Final    NONE Performed at [facility], [address], [place], [place] [address]    Culture (A)  Final    <10,000 COLONIES/mL INSIGNIFICANT  GROWTH Performed at [facility], [address], [place], [place] [address]    Report Status 02/25/2018 FINAL  Final     Labs: BNP (last 3 results) No results for input(s): BNP in the last 8760 hours. Basic Metabolic Panel: Recent Labs  Lab 02/23/18 1450 02/24/18 0518 02/25/18 0438 02/26/18 0509 02/28/18 0506  NA 140 143 137 140 138  K 3.9 3.6 4.2 4.4 4.7  CL 102 111 101 102 99  CO2 26 22 26 29  32  GLUCOSE 256* 71 159* 242* 94  BUN 21* 20 9 7 19   CREATININE 0.67 0.56* 0.57* 0.57* 0.59*  CALCIUM 9.5 8.5* 8.8* 8.9 9.3  MG  --   --   --   --  2.1   Liver Function Tests: Recent Labs  Lab 02/23/18 1450 02/24/18 0518 02/28/18 0506  AST 51* 34 28  ALT 112* 77* 53*  ALKPHOS 101 73 82  BILITOT 1.2 1.3* 0.6  PROT 7.6 6.0* 6.7  ALBUMIN 4.3 3.3* 3.7   Recent Labs  Lab 02/23/18 1450  LIPASE 26   No results for input(s): AMMONIA in the last 168 hours. CBC: Recent Labs  Lab 02/23/18 1450 02/24/18 0518 02/26/18 0509 02/28/18 0506  WBC 8.8 8.4 6.0 7.5  NEUTROABS  --   --  2.3  --   HGB 14.7 12.3* 12.6* 14.1  HCT 42.7 37.0* 36.7* 40.3  MCV 81.5 83.5 81.7 80.8  PLT 326 301 282 348   Cardiac Enzymes: No results for input(s): CKTOTAL, CKMB, CKMBINDEX, TROPONINI in the last 168 hours. BNP: Invalid input(s): POCBNP CBG: Recent Labs  Lab 02/27/18 1943 02/28/18 0011 02/28/18 0448 02/28/18 0542 02/28/18 0743  GLUCAP 289* 301* 101* 92 251*   D-Dimer No results for input(s): DDIMER in the last 72 hours. Hgb A1c No results for input(s): HGBA1C in the last 72 hours. Lipid Profile No results for input(s): CHOL, HDL, LDLCALC, TRIG, CHOLHDL, LDLDIRECT in the last 72 hours. Thyroid function studies No results for input(s): TSH, T4TOTAL, T3FREE, THYROIDAB in the last 72 hours.  Invalid input(s): FREET3 Anemia work up No results for input(s): VITAMINB12, FOLATE, FERRITIN, TIBC, IRON, RETICCTPCT in the last 72 hours. Urinalysis    Component Value Date/Time    COLORURINE AMBER (A) 02/23/2018 1700   APPEARANCEUR HAZY (A) 02/23/2018 1700   LABSPEC 1.032 (H) 02/23/2018 1700   PHURINE 8.0 02/23/2018 1700   GLUCOSEU >=500 (A) 02/23/2018 1700   HGBUR NEGATIVE 02/23/2018 1700   BILIRUBINUR NEGATIVE 02/23/2018 1700   KETONESUR 80 (A) 02/23/2018 1700   PROTEINUR 100 (A) 02/23/2018 1700   UROBILINOGEN 0.2 02/15/2012 0108   NITRITE NEGATIVE 02/23/2018 1700   LEUKOCYTESUR NEGATIVE 02/23/2018 1700   Sepsis Labs Invalid input(s): PROCALCITONIN,  WBC,  LACTICIDVEN Microbiology Recent Results (from the past 240 hour(s))  Urine culture     Status: Abnormal   Collection Time: 02/23/18  5:00 PM  Result Value Ref Range Status   Specimen Description   Final    URINE, RANDOM Performed at [facility], [address] [place], [place] [address]    Special Requests   Final    NONE Performed at [facility], [address] [place], [place] [address]    Culture (A)  Final    <10,000 COLONIES/mL INSIGNIFICANT GROWTH Performed at [facility], [address], [place], [place] [address]    Report Status 02/25/2018 FINAL  Final     Time coordinating discharge: 35  SIGNED:   Delaine Lame, MD  [facility] 02/28/2018, 11:09 AM   If 7PM-7AM, please contact night-coverage [email or web address] Password [email or web address]

## 2018-04-25 ENCOUNTER — Encounter: Payer: Self-pay | Admitting: Endocrinology

## 2018-04-26 DIAGNOSIS — Z9111 Patient's noncompliance with dietary regimen: Secondary | ICD-10-CM | POA: Diagnosis not present

## 2018-04-26 DIAGNOSIS — F1424 Cocaine dependence with cocaine-induced mood disorder: Secondary | ICD-10-CM | POA: Diagnosis not present

## 2018-04-26 DIAGNOSIS — K3184 Gastroparesis: Secondary | ICD-10-CM | POA: Diagnosis not present

## 2018-04-26 DIAGNOSIS — F329 Major depressive disorder, single episode, unspecified: Secondary | ICD-10-CM | POA: Diagnosis not present

## 2018-04-26 DIAGNOSIS — I10 Essential (primary) hypertension: Secondary | ICD-10-CM | POA: Diagnosis not present

## 2018-04-26 DIAGNOSIS — E1065 Type 1 diabetes mellitus with hyperglycemia: Secondary | ICD-10-CM | POA: Diagnosis not present

## 2018-04-26 DIAGNOSIS — E1143 Type 2 diabetes mellitus with diabetic autonomic (poly)neuropathy: Secondary | ICD-10-CM | POA: Diagnosis not present

## 2018-04-26 DIAGNOSIS — R0902 Hypoxemia: Secondary | ICD-10-CM | POA: Diagnosis not present

## 2018-04-26 DIAGNOSIS — R1084 Generalized abdominal pain: Secondary | ICD-10-CM | POA: Diagnosis not present

## 2018-04-26 DIAGNOSIS — R45851 Suicidal ideations: Secondary | ICD-10-CM | POA: Diagnosis not present

## 2018-04-26 DIAGNOSIS — R52 Pain, unspecified: Secondary | ICD-10-CM | POA: Diagnosis not present

## 2018-04-26 DIAGNOSIS — E1043 Type 1 diabetes mellitus with diabetic autonomic (poly)neuropathy: Secondary | ICD-10-CM | POA: Diagnosis not present

## 2018-04-26 DIAGNOSIS — T1491XA Suicide attempt, initial encounter: Secondary | ICD-10-CM | POA: Diagnosis not present

## 2018-04-26 DIAGNOSIS — E1042 Type 1 diabetes mellitus with diabetic polyneuropathy: Secondary | ICD-10-CM | POA: Diagnosis not present

## 2018-04-26 DIAGNOSIS — Z915 Personal history of self-harm: Secondary | ICD-10-CM | POA: Diagnosis not present

## 2018-04-26 DIAGNOSIS — T383X2A Poisoning by insulin and oral hypoglycemic [antidiabetic] drugs, intentional self-harm, initial encounter: Secondary | ICD-10-CM | POA: Diagnosis not present

## 2018-04-26 DIAGNOSIS — Z794 Long term (current) use of insulin: Secondary | ICD-10-CM | POA: Diagnosis not present

## 2018-04-26 DIAGNOSIS — E1165 Type 2 diabetes mellitus with hyperglycemia: Secondary | ICD-10-CM | POA: Diagnosis not present

## 2018-04-26 DIAGNOSIS — F1220 Cannabis dependence, uncomplicated: Secondary | ICD-10-CM | POA: Diagnosis not present

## 2018-04-26 DIAGNOSIS — F419 Anxiety disorder, unspecified: Secondary | ICD-10-CM | POA: Diagnosis not present

## 2018-04-26 DIAGNOSIS — F17210 Nicotine dependence, cigarettes, uncomplicated: Secondary | ICD-10-CM | POA: Diagnosis not present

## 2018-04-26 DIAGNOSIS — Z590 Homelessness: Secondary | ICD-10-CM | POA: Diagnosis not present

## 2018-04-26 DIAGNOSIS — F1420 Cocaine dependence, uncomplicated: Secondary | ICD-10-CM | POA: Diagnosis not present

## 2018-04-26 DIAGNOSIS — F322 Major depressive disorder, single episode, severe without psychotic features: Secondary | ICD-10-CM | POA: Diagnosis not present

## 2018-04-26 DIAGNOSIS — Z833 Family history of diabetes mellitus: Secondary | ICD-10-CM | POA: Diagnosis not present

## 2018-04-26 DIAGNOSIS — E109 Type 1 diabetes mellitus without complications: Secondary | ICD-10-CM | POA: Diagnosis not present

## 2018-04-26 DIAGNOSIS — F1910 Other psychoactive substance abuse, uncomplicated: Secondary | ICD-10-CM | POA: Diagnosis not present

## 2018-04-27 DIAGNOSIS — F329 Major depressive disorder, single episode, unspecified: Secondary | ICD-10-CM | POA: Diagnosis not present

## 2018-04-27 DIAGNOSIS — E109 Type 1 diabetes mellitus without complications: Secondary | ICD-10-CM | POA: Diagnosis not present

## 2018-04-27 DIAGNOSIS — E1143 Type 2 diabetes mellitus with diabetic autonomic (poly)neuropathy: Secondary | ICD-10-CM | POA: Diagnosis not present

## 2018-04-27 DIAGNOSIS — K3184 Gastroparesis: Secondary | ICD-10-CM | POA: Diagnosis not present

## 2018-04-28 DIAGNOSIS — F1420 Cocaine dependence, uncomplicated: Secondary | ICD-10-CM | POA: Diagnosis not present

## 2018-04-28 DIAGNOSIS — E109 Type 1 diabetes mellitus without complications: Secondary | ICD-10-CM | POA: Diagnosis not present

## 2018-04-28 DIAGNOSIS — F1220 Cannabis dependence, uncomplicated: Secondary | ICD-10-CM | POA: Diagnosis not present

## 2018-04-28 DIAGNOSIS — F1424 Cocaine dependence with cocaine-induced mood disorder: Secondary | ICD-10-CM | POA: Diagnosis not present

## 2018-04-28 DIAGNOSIS — F322 Major depressive disorder, single episode, severe without psychotic features: Secondary | ICD-10-CM | POA: Diagnosis not present

## 2018-04-29 DIAGNOSIS — F1424 Cocaine dependence with cocaine-induced mood disorder: Secondary | ICD-10-CM | POA: Diagnosis not present

## 2018-04-29 DIAGNOSIS — E109 Type 1 diabetes mellitus without complications: Secondary | ICD-10-CM | POA: Diagnosis not present

## 2018-04-29 DIAGNOSIS — F1220 Cannabis dependence, uncomplicated: Secondary | ICD-10-CM | POA: Diagnosis not present

## 2018-04-30 DIAGNOSIS — F329 Major depressive disorder, single episode, unspecified: Secondary | ICD-10-CM | POA: Diagnosis not present

## 2018-04-30 DIAGNOSIS — F1910 Other psychoactive substance abuse, uncomplicated: Secondary | ICD-10-CM | POA: Diagnosis not present

## 2018-04-30 DIAGNOSIS — T1491XA Suicide attempt, initial encounter: Secondary | ICD-10-CM | POA: Diagnosis not present

## 2018-04-30 DIAGNOSIS — R45851 Suicidal ideations: Secondary | ICD-10-CM | POA: Diagnosis not present

## 2018-05-01 DIAGNOSIS — F322 Major depressive disorder, single episode, severe without psychotic features: Secondary | ICD-10-CM | POA: Diagnosis not present

## 2018-05-01 DIAGNOSIS — E1065 Type 1 diabetes mellitus with hyperglycemia: Secondary | ICD-10-CM | POA: Diagnosis not present

## 2018-05-01 DIAGNOSIS — F329 Major depressive disorder, single episode, unspecified: Secondary | ICD-10-CM | POA: Diagnosis not present

## 2018-05-01 DIAGNOSIS — Z915 Personal history of self-harm: Secondary | ICD-10-CM | POA: Diagnosis not present

## 2018-05-01 DIAGNOSIS — T1491XA Suicide attempt, initial encounter: Secondary | ICD-10-CM | POA: Diagnosis not present

## 2018-05-01 DIAGNOSIS — Z9111 Patient's noncompliance with dietary regimen: Secondary | ICD-10-CM | POA: Diagnosis not present

## 2018-05-01 DIAGNOSIS — F1220 Cannabis dependence, uncomplicated: Secondary | ICD-10-CM | POA: Diagnosis not present

## 2018-05-01 DIAGNOSIS — F1420 Cocaine dependence, uncomplicated: Secondary | ICD-10-CM | POA: Diagnosis not present

## 2018-05-01 DIAGNOSIS — F1910 Other psychoactive substance abuse, uncomplicated: Secondary | ICD-10-CM | POA: Diagnosis not present

## 2018-05-01 DIAGNOSIS — F1424 Cocaine dependence with cocaine-induced mood disorder: Secondary | ICD-10-CM | POA: Diagnosis not present

## 2018-05-01 DIAGNOSIS — R45851 Suicidal ideations: Secondary | ICD-10-CM | POA: Diagnosis not present

## 2018-05-02 DIAGNOSIS — R45851 Suicidal ideations: Secondary | ICD-10-CM | POA: Diagnosis not present

## 2018-05-02 DIAGNOSIS — F322 Major depressive disorder, single episode, severe without psychotic features: Secondary | ICD-10-CM | POA: Diagnosis not present

## 2018-05-02 DIAGNOSIS — E1065 Type 1 diabetes mellitus with hyperglycemia: Secondary | ICD-10-CM | POA: Diagnosis not present

## 2018-05-02 DIAGNOSIS — T1491XA Suicide attempt, initial encounter: Secondary | ICD-10-CM | POA: Diagnosis not present

## 2018-05-02 DIAGNOSIS — Z9111 Patient's noncompliance with dietary regimen: Secondary | ICD-10-CM | POA: Diagnosis not present

## 2018-05-02 DIAGNOSIS — F1910 Other psychoactive substance abuse, uncomplicated: Secondary | ICD-10-CM | POA: Diagnosis not present

## 2018-05-02 DIAGNOSIS — Z915 Personal history of self-harm: Secondary | ICD-10-CM | POA: Diagnosis not present

## 2018-05-02 DIAGNOSIS — F329 Major depressive disorder, single episode, unspecified: Secondary | ICD-10-CM | POA: Diagnosis not present

## 2018-05-02 DIAGNOSIS — F1220 Cannabis dependence, uncomplicated: Secondary | ICD-10-CM | POA: Diagnosis not present

## 2018-05-02 DIAGNOSIS — F1420 Cocaine dependence, uncomplicated: Secondary | ICD-10-CM | POA: Diagnosis not present

## 2018-05-02 DIAGNOSIS — F1424 Cocaine dependence with cocaine-induced mood disorder: Secondary | ICD-10-CM | POA: Diagnosis not present

## 2018-05-03 DIAGNOSIS — M79662 Pain in left lower leg: Secondary | ICD-10-CM | POA: Diagnosis not present

## 2018-05-03 DIAGNOSIS — Z794 Long term (current) use of insulin: Secondary | ICD-10-CM | POA: Diagnosis not present

## 2018-05-03 DIAGNOSIS — Z720 Tobacco use: Secondary | ICD-10-CM | POA: Diagnosis not present

## 2018-05-03 DIAGNOSIS — R45851 Suicidal ideations: Secondary | ICD-10-CM | POA: Diagnosis not present

## 2018-05-03 DIAGNOSIS — E1065 Type 1 diabetes mellitus with hyperglycemia: Secondary | ICD-10-CM | POA: Diagnosis not present

## 2018-05-03 DIAGNOSIS — R000 Tachycardia, unspecified: Secondary | ICD-10-CM | POA: Diagnosis not present

## 2018-05-03 DIAGNOSIS — F1910 Other psychoactive substance abuse, uncomplicated: Secondary | ICD-10-CM | POA: Diagnosis not present

## 2018-05-03 DIAGNOSIS — Z781 Physical restraint status: Secondary | ICD-10-CM | POA: Diagnosis not present

## 2018-05-03 DIAGNOSIS — E1040 Type 1 diabetes mellitus with diabetic neuropathy, unspecified: Secondary | ICD-10-CM | POA: Diagnosis not present

## 2018-05-03 DIAGNOSIS — E109 Type 1 diabetes mellitus without complications: Secondary | ICD-10-CM | POA: Diagnosis not present

## 2018-05-03 DIAGNOSIS — R739 Hyperglycemia, unspecified: Secondary | ICD-10-CM | POA: Diagnosis not present

## 2018-05-03 DIAGNOSIS — E1043 Type 1 diabetes mellitus with diabetic autonomic (poly)neuropathy: Secondary | ICD-10-CM | POA: Diagnosis not present

## 2018-05-03 DIAGNOSIS — K219 Gastro-esophageal reflux disease without esophagitis: Secondary | ICD-10-CM | POA: Diagnosis not present

## 2018-05-03 DIAGNOSIS — B182 Chronic viral hepatitis C: Secondary | ICD-10-CM | POA: Diagnosis not present

## 2018-05-03 DIAGNOSIS — S91112A Laceration without foreign body of left great toe without damage to nail, initial encounter: Secondary | ICD-10-CM | POA: Diagnosis not present

## 2018-05-03 DIAGNOSIS — T1491XA Suicide attempt, initial encounter: Secondary | ICD-10-CM | POA: Diagnosis not present

## 2018-05-03 DIAGNOSIS — K3184 Gastroparesis: Secondary | ICD-10-CM | POA: Diagnosis not present

## 2018-05-03 DIAGNOSIS — F329 Major depressive disorder, single episode, unspecified: Secondary | ICD-10-CM | POA: Diagnosis not present

## 2018-05-03 DIAGNOSIS — Z915 Personal history of self-harm: Secondary | ICD-10-CM | POA: Diagnosis not present

## 2018-05-03 DIAGNOSIS — B1920 Unspecified viral hepatitis C without hepatic coma: Secondary | ICD-10-CM | POA: Diagnosis not present

## 2018-05-03 DIAGNOSIS — Z590 Homelessness: Secondary | ICD-10-CM | POA: Diagnosis not present

## 2018-05-03 DIAGNOSIS — I10 Essential (primary) hypertension: Secondary | ICD-10-CM | POA: Diagnosis not present

## 2018-05-03 MED ORDER — MELATONIN 3 MG PO TABS
6.00 | ORAL_TABLET | ORAL | Status: DC
Start: 2018-05-03 — End: 2018-05-03

## 2018-05-03 MED ORDER — DEXTROSE 10 % IV SOLN
125.00 | INTRAVENOUS | Status: DC
Start: ? — End: 2018-05-03

## 2018-05-03 MED ORDER — ACETAMINOPHEN 325 MG PO TABS
650.00 | ORAL_TABLET | ORAL | Status: DC
Start: ? — End: 2018-05-03

## 2018-05-03 MED ORDER — INSULIN LISPRO 100 UNIT/ML ~~LOC~~ SOLN
1.00 | SUBCUTANEOUS | Status: DC
Start: 2018-05-03 — End: 2018-05-03

## 2018-05-03 MED ORDER — INSULIN GLARGINE 100 UNIT/ML SOLOSTAR PEN
12.00 | PEN_INJECTOR | SUBCUTANEOUS | Status: DC
Start: 2018-05-04 — End: 2018-05-03

## 2018-05-03 MED ORDER — RANITIDINE HCL 150 MG PO TABS
150.00 | ORAL_TABLET | ORAL | Status: DC
Start: 2018-05-03 — End: 2018-05-03

## 2018-05-03 MED ORDER — DEXTROSE 10 % IV SOLN
250.00 | INTRAVENOUS | Status: DC
Start: ? — End: 2018-05-03

## 2018-05-03 MED ORDER — MIRTAZAPINE 15 MG PO TABS
15.00 | ORAL_TABLET | ORAL | Status: DC
Start: 2018-05-03 — End: 2018-05-03

## 2018-05-03 MED ORDER — LISINOPRIL 5 MG PO TABS
5.00 | ORAL_TABLET | ORAL | Status: DC
Start: 2018-05-04 — End: 2018-05-03

## 2018-05-03 MED ORDER — ONDANSETRON 4 MG PO TBDP
4.00 | ORAL_TABLET | ORAL | Status: DC
Start: ? — End: 2018-05-03

## 2018-05-03 MED ORDER — DICYCLOMINE HCL 10 MG PO CAPS
10.00 | ORAL_CAPSULE | ORAL | Status: DC
Start: 2018-05-03 — End: 2018-05-03

## 2018-05-03 MED ORDER — BUTALBITAL-APAP-CAFFEINE 50-325-40 MG PO TABS
1.00 | ORAL_TABLET | ORAL | Status: DC
Start: ? — End: 2018-05-03

## 2018-05-03 MED ORDER — ENOXAPARIN SODIUM 40 MG/0.4ML ~~LOC~~ SOLN
40.00 | SUBCUTANEOUS | Status: DC
Start: 2018-05-04 — End: 2018-05-03

## 2018-05-03 MED ORDER — INSULIN LISPRO 100 UNIT/ML ~~LOC~~ SOLN
4.00 | SUBCUTANEOUS | Status: DC
Start: 2018-05-03 — End: 2018-05-03

## 2018-05-03 MED ORDER — CHOLECALCIFEROL 25 MCG (1000 UT) PO TABS
1000.00 | ORAL_TABLET | ORAL | Status: DC
Start: 2018-05-04 — End: 2018-05-03

## 2018-05-03 MED ORDER — LOPERAMIDE HCL 2 MG PO CAPS
2.00 | ORAL_CAPSULE | ORAL | Status: DC
Start: ? — End: 2018-05-03

## 2018-05-03 MED ORDER — NICOTINE 14 MG/24HR TD PT24
1.00 | MEDICATED_PATCH | TRANSDERMAL | Status: DC
Start: 2018-05-04 — End: 2018-05-03

## 2018-05-04 DIAGNOSIS — F329 Major depressive disorder, single episode, unspecified: Secondary | ICD-10-CM | POA: Diagnosis not present

## 2018-05-05 DIAGNOSIS — F329 Major depressive disorder, single episode, unspecified: Secondary | ICD-10-CM | POA: Diagnosis not present

## 2018-05-06 DIAGNOSIS — F329 Major depressive disorder, single episode, unspecified: Secondary | ICD-10-CM | POA: Diagnosis not present

## 2018-05-08 DIAGNOSIS — F329 Major depressive disorder, single episode, unspecified: Secondary | ICD-10-CM | POA: Diagnosis not present

## 2018-05-09 DIAGNOSIS — F329 Major depressive disorder, single episode, unspecified: Secondary | ICD-10-CM | POA: Diagnosis not present

## 2018-05-09 DIAGNOSIS — B1920 Unspecified viral hepatitis C without hepatic coma: Secondary | ICD-10-CM | POA: Diagnosis not present

## 2018-05-09 DIAGNOSIS — E1040 Type 1 diabetes mellitus with diabetic neuropathy, unspecified: Secondary | ICD-10-CM | POA: Diagnosis not present

## 2018-05-10 DIAGNOSIS — F329 Major depressive disorder, single episode, unspecified: Secondary | ICD-10-CM | POA: Diagnosis not present

## 2018-05-11 DIAGNOSIS — F329 Major depressive disorder, single episode, unspecified: Secondary | ICD-10-CM | POA: Diagnosis not present

## 2018-05-12 DIAGNOSIS — F329 Major depressive disorder, single episode, unspecified: Secondary | ICD-10-CM | POA: Diagnosis not present

## 2018-05-12 DIAGNOSIS — E1040 Type 1 diabetes mellitus with diabetic neuropathy, unspecified: Secondary | ICD-10-CM | POA: Diagnosis not present

## 2018-05-18 DIAGNOSIS — R739 Hyperglycemia, unspecified: Secondary | ICD-10-CM | POA: Diagnosis not present

## 2018-05-18 DIAGNOSIS — S91112A Laceration without foreign body of left great toe without damage to nail, initial encounter: Secondary | ICD-10-CM | POA: Diagnosis not present

## 2018-05-18 DIAGNOSIS — M79662 Pain in left lower leg: Secondary | ICD-10-CM | POA: Diagnosis not present

## 2018-05-18 DIAGNOSIS — R000 Tachycardia, unspecified: Secondary | ICD-10-CM | POA: Diagnosis not present

## 2018-05-18 DIAGNOSIS — E1040 Type 1 diabetes mellitus with diabetic neuropathy, unspecified: Secondary | ICD-10-CM | POA: Diagnosis not present

## 2018-05-19 DIAGNOSIS — E1040 Type 1 diabetes mellitus with diabetic neuropathy, unspecified: Secondary | ICD-10-CM | POA: Diagnosis not present

## 2018-05-19 DIAGNOSIS — M79662 Pain in left lower leg: Secondary | ICD-10-CM | POA: Diagnosis not present

## 2018-05-19 DIAGNOSIS — R000 Tachycardia, unspecified: Secondary | ICD-10-CM | POA: Diagnosis not present

## 2018-05-20 DIAGNOSIS — F329 Major depressive disorder, single episode, unspecified: Secondary | ICD-10-CM | POA: Diagnosis not present

## 2018-05-22 DIAGNOSIS — E1040 Type 1 diabetes mellitus with diabetic neuropathy, unspecified: Secondary | ICD-10-CM | POA: Diagnosis not present

## 2018-05-22 DIAGNOSIS — S91112A Laceration without foreign body of left great toe without damage to nail, initial encounter: Secondary | ICD-10-CM | POA: Diagnosis not present

## 2018-05-22 DIAGNOSIS — R000 Tachycardia, unspecified: Secondary | ICD-10-CM | POA: Diagnosis not present

## 2018-05-23 DIAGNOSIS — F329 Major depressive disorder, single episode, unspecified: Secondary | ICD-10-CM | POA: Diagnosis not present

## 2018-05-24 DIAGNOSIS — F329 Major depressive disorder, single episode, unspecified: Secondary | ICD-10-CM | POA: Diagnosis not present

## 2018-05-25 DIAGNOSIS — F329 Major depressive disorder, single episode, unspecified: Secondary | ICD-10-CM | POA: Diagnosis not present

## 2018-05-26 DIAGNOSIS — F329 Major depressive disorder, single episode, unspecified: Secondary | ICD-10-CM | POA: Diagnosis not present

## 2018-05-27 DIAGNOSIS — F329 Major depressive disorder, single episode, unspecified: Secondary | ICD-10-CM | POA: Diagnosis not present

## 2018-05-30 DIAGNOSIS — E1040 Type 1 diabetes mellitus with diabetic neuropathy, unspecified: Secondary | ICD-10-CM | POA: Diagnosis not present

## 2018-06-15 ENCOUNTER — Ambulatory Visit: Payer: BLUE CROSS/BLUE SHIELD | Admitting: Internal Medicine

## 2018-06-15 DIAGNOSIS — Z0289 Encounter for other administrative examinations: Secondary | ICD-10-CM

## 2018-09-23 MED ORDER — MUPIROCIN 2 % EX OINT
TOPICAL_OINTMENT | CUTANEOUS | Status: DC
Start: 2018-09-22 — End: 2018-09-23

## 2018-09-23 MED ORDER — SODIUM CHLORIDE 0.9 % IV SOLN
75.00 | INTRAVENOUS | Status: DC
Start: ? — End: 2018-09-23

## 2018-09-23 MED ORDER — FLUCONAZOLE IN SODIUM CHLORIDE 100-0.9 MG/50ML-% IV SOLN
100.00 | INTRAVENOUS | Status: DC
Start: 2018-09-23 — End: 2018-09-23

## 2018-09-23 MED ORDER — HYDRALAZINE HCL 20 MG/ML IJ SOLN
10.00 | INTRAMUSCULAR | Status: DC
Start: ? — End: 2018-09-23

## 2018-09-23 MED ORDER — MORPHINE SULFATE (PF) 4 MG/ML IV SOLN
4.00 | INTRAVENOUS | Status: DC
Start: ? — End: 2018-09-23

## 2018-09-23 MED ORDER — GENERIC EXTERNAL MEDICATION
Status: DC
Start: ? — End: 2018-09-23

## 2018-09-23 MED ORDER — INSULIN GLARGINE 100 UNIT/ML ~~LOC~~ SOLN
1.00 | SUBCUTANEOUS | Status: DC
Start: ? — End: 2018-09-23

## 2018-09-23 MED ORDER — SODIUM CHLORIDE 0.9 % IV SOLN
10.00 | INTRAVENOUS | Status: DC
Start: ? — End: 2018-09-23

## 2018-09-23 MED ORDER — LORAZEPAM 0.5 MG PO TABS
0.50 | ORAL_TABLET | ORAL | Status: DC
Start: ? — End: 2018-09-23

## 2018-09-23 MED ORDER — PROMETHAZINE HCL 25 MG/ML IJ SOLN
12.50 | INTRAMUSCULAR | Status: DC
Start: ? — End: 2018-09-23

## 2018-09-23 MED ORDER — MIRTAZAPINE 15 MG PO TABS
7.50 | ORAL_TABLET | ORAL | Status: DC
Start: 2018-09-22 — End: 2018-09-23

## 2018-09-23 MED ORDER — LISINOPRIL 10 MG PO TABS
10.00 | ORAL_TABLET | ORAL | Status: DC
Start: 2018-09-23 — End: 2018-09-23

## 2018-09-23 MED ORDER — PANTOPRAZOLE SODIUM 40 MG IV SOLR
40.00 | INTRAVENOUS | Status: DC
Start: 2018-09-22 — End: 2018-09-23

## 2018-09-23 MED ORDER — ENOXAPARIN SODIUM 40 MG/0.4ML ~~LOC~~ SOLN
40.00 | SUBCUTANEOUS | Status: DC
Start: 2018-09-22 — End: 2018-09-23

## 2018-09-27 MED ORDER — SODIUM CHLORIDE 0.9 % IV SOLN
10.00 | INTRAVENOUS | Status: DC
Start: ? — End: 2018-09-27

## 2018-09-27 MED ORDER — GENERIC EXTERNAL MEDICATION
Status: DC
Start: ? — End: 2018-09-27

## 2018-10-02 MED ORDER — SODIUM CHLORIDE 0.9 % IV SOLN
10.00 | INTRAVENOUS | Status: DC
Start: ? — End: 2018-10-02

## 2018-10-02 MED ORDER — GENERIC EXTERNAL MEDICATION
Status: DC
Start: ? — End: 2018-10-02

## 2019-02-10 MED ORDER — INSULIN GLARGINE 100 UNIT/ML SOLOSTAR PEN
12.00 | PEN_INJECTOR | SUBCUTANEOUS | Status: DC
Start: 2019-02-11 — End: 2019-02-10

## 2019-03-07 MED ORDER — GENERIC EXTERNAL MEDICATION
Status: DC
Start: ? — End: 2019-03-07

## 2019-03-07 MED ORDER — LEVOFLOXACIN 500 MG PO TABS
750.00 | ORAL_TABLET | ORAL | Status: DC
Start: ? — End: 2019-03-07

## 2019-03-07 MED ORDER — INSULIN LISPRO 100 UNIT/ML ~~LOC~~ SOLN
1.00 | SUBCUTANEOUS | Status: DC
Start: 2019-03-06 — End: 2019-03-07

## 2019-03-07 MED ORDER — INSULIN GLARGINE 100 UNIT/ML ~~LOC~~ SOLN
1.00 | SUBCUTANEOUS | Status: DC
Start: 2019-03-06 — End: 2019-03-07

## 2019-03-07 MED ORDER — ENOXAPARIN SODIUM 40 MG/0.4ML ~~LOC~~ SOLN
40.00 | SUBCUTANEOUS | Status: DC
Start: 2019-03-06 — End: 2019-03-07

## 2019-03-07 MED ORDER — GUAIFENESIN 100 MG/5ML PO LIQD
10.00 | ORAL | Status: DC
Start: ? — End: 2019-03-07

## 2019-03-07 MED ORDER — ACETAMINOPHEN 325 MG PO TABS
650.00 | ORAL_TABLET | ORAL | Status: DC
Start: ? — End: 2019-03-07

## 2019-03-07 MED ORDER — LISINOPRIL 20 MG PO TABS
20.00 | ORAL_TABLET | ORAL | Status: DC
Start: 2019-03-07 — End: 2019-03-07

## 2019-03-07 MED ORDER — INSULIN LISPRO 100 UNIT/ML ~~LOC~~ SOLN
1.00 | SUBCUTANEOUS | Status: DC
Start: ? — End: 2019-03-07

## 2019-03-07 MED ORDER — HYDROXYZINE HCL 25 MG PO TABS
50.00 | ORAL_TABLET | ORAL | Status: DC
Start: ? — End: 2019-03-07

## 2019-03-07 MED ORDER — SODIUM CHLORIDE 0.9 % IV SOLN
10.00 | INTRAVENOUS | Status: DC
Start: ? — End: 2019-03-07

## 2019-03-07 MED ORDER — ALBUTEROL SULFATE (2.5 MG/3ML) 0.083% IN NEBU
2.50 | INHALATION_SOLUTION | RESPIRATORY_TRACT | Status: DC
Start: ? — End: 2019-03-07

## 2019-03-07 MED ORDER — DOCUSATE SODIUM 100 MG PO CAPS
100.00 | ORAL_CAPSULE | ORAL | Status: DC
Start: 2019-03-06 — End: 2019-03-07

## 2019-03-07 MED ORDER — INSULIN GLARGINE 100 UNIT/ML ~~LOC~~ SOLN
1.00 | SUBCUTANEOUS | Status: DC
Start: ? — End: 2019-03-07

## 2019-03-07 MED ORDER — ONDANSETRON HCL 4 MG/2ML IJ SOLN
4.00 | INTRAMUSCULAR | Status: DC
Start: ? — End: 2019-03-07

## 2019-03-23 MED ORDER — GENERIC EXTERNAL MEDICATION
Status: DC
Start: ? — End: 2019-03-23

## 2019-03-23 MED ORDER — BUSPIRONE HCL 5 MG PO TABS
10.00 | ORAL_TABLET | ORAL | Status: DC
Start: 2019-03-23 — End: 2019-03-23

## 2019-03-23 MED ORDER — DULOXETINE HCL 30 MG PO CPEP
60.00 | ORAL_CAPSULE | ORAL | Status: DC
Start: 2019-03-24 — End: 2019-03-23

## 2019-03-23 MED ORDER — GENERIC EXTERNAL MEDICATION
150.00 | Status: DC
Start: 2019-03-23 — End: 2019-03-23

## 2019-03-23 MED ORDER — INSULIN GLARGINE 100 UNIT/ML ~~LOC~~ SOLN
14.00 | SUBCUTANEOUS | Status: DC
Start: 2019-03-23 — End: 2019-03-23

## 2019-03-23 MED ORDER — INSULIN REGULAR HUMAN 100 UNIT/ML IJ SOLN
INTRAMUSCULAR | Status: DC
Start: 2019-03-24 — End: 2019-03-23

## 2019-03-23 MED ORDER — GABAPENTIN 300 MG PO CAPS
600.00 | ORAL_CAPSULE | ORAL | Status: DC
Start: 2019-03-23 — End: 2019-03-23

## 2019-04-05 ENCOUNTER — Encounter (HOSPITAL_COMMUNITY): Payer: Self-pay

## 2019-04-05 ENCOUNTER — Emergency Department (HOSPITAL_COMMUNITY)
Admission: EM | Admit: 2019-04-05 | Discharge: 2019-04-05 | Disposition: A | Discharge status: Home / Self Care | Payer: Medicaid Other | Attending: Emergency Medicine | Admitting: Emergency Medicine

## 2019-04-05 ENCOUNTER — Other Ambulatory Visit: Payer: Self-pay

## 2019-04-05 DIAGNOSIS — R1084 Generalized abdominal pain: Secondary | ICD-10-CM | POA: Diagnosis not present

## 2019-04-05 DIAGNOSIS — Z794 Long term (current) use of insulin: Secondary | ICD-10-CM | POA: Insufficient documentation

## 2019-04-05 DIAGNOSIS — E1065 Type 1 diabetes mellitus with hyperglycemia: Secondary | ICD-10-CM

## 2019-04-05 DIAGNOSIS — Z79899 Other long term (current) drug therapy: Secondary | ICD-10-CM | POA: Insufficient documentation

## 2019-04-05 DIAGNOSIS — R197 Diarrhea, unspecified: Secondary | ICD-10-CM | POA: Diagnosis not present

## 2019-04-05 DIAGNOSIS — R112 Nausea with vomiting, unspecified: Secondary | ICD-10-CM

## 2019-04-05 DIAGNOSIS — Z87891 Personal history of nicotine dependence: Secondary | ICD-10-CM | POA: Diagnosis not present

## 2019-04-05 LAB — COMPREHENSIVE METABOLIC PANEL
ALT: 44 U/L (ref 0–44)
AST: 38 U/L (ref 15–41)
Albumin: 3.2 g/dL — ABNORMAL LOW (ref 3.5–5.0)
Alkaline Phosphatase: 97 U/L (ref 38–126)
Anion gap: 11 (ref 5–15)
BUN: 14 mg/dL (ref 6–20)
CO2: 27 mmol/L (ref 22–32)
Calcium: 8.3 mg/dL — ABNORMAL LOW (ref 8.9–10.3)
Chloride: 94 mmol/L — ABNORMAL LOW (ref 98–111)
Creatinine, Ser: 0.52 mg/dL — ABNORMAL LOW (ref 0.61–1.24)
GFR calc Af Amer: >60 mL/min (ref >60)
GFR calc non Af Amer: >60 mL/min (ref >60)
Glucose, Bld: 311 mg/dL — ABNORMAL HIGH (ref 70–99)
Potassium: 4.2 mmol/L (ref 3.5–5.1)
Sodium: 132 mmol/L — ABNORMAL LOW (ref 135–145)
Total Bilirubin: 0.5 mg/dL (ref 0.3–1.2)
Total Protein: 6.2 g/dL — ABNORMAL LOW (ref 6.5–8.1)

## 2019-04-05 LAB — CBC WITH DIFFERENTIAL/PLATELET
Abs Immature Granulocytes: 0.04 10*3/uL (ref 0.00–0.07)
Basophils Absolute: 0.1 10*3/uL (ref 0.0–0.1)
Basophils Relative: 1 %
Eosinophils Absolute: 0.4 10*3/uL (ref 0.0–0.5)
Eosinophils Relative: 4 %
HCT: 35.8 % — ABNORMAL LOW (ref 39.0–52.0)
Hemoglobin: 11.9 g/dL — ABNORMAL LOW (ref 13.0–17.0)
Immature Granulocytes: 0 %
Lymphocytes Relative: 15 %
Lymphs Abs: 1.6 10*3/uL (ref 0.7–4.0)
MCH: 29.1 pg (ref 26.0–34.0)
MCHC: 33.2 g/dL (ref 30.0–36.0)
MCV: 87.5 fL (ref 80.0–100.0)
Monocytes Absolute: 0.7 10*3/uL (ref 0.1–1.0)
Monocytes Relative: 7 %
Neutro Abs: 7.7 10*3/uL (ref 1.7–7.7)
Neutrophils Relative %: 73 %
Platelets: 298 10*3/uL (ref 150–400)
RBC: 4.09 MIL/uL — ABNORMAL LOW (ref 4.22–5.81)
RDW: 13.2 % (ref 11.5–15.5)
WBC: 10.4 10*3/uL (ref 4.0–10.5)
nRBC: 0 % (ref 0.0–0.2)

## 2019-04-05 LAB — URINALYSIS, ROUTINE W REFLEX MICROSCOPIC
Bilirubin Urine: NEGATIVE
Glucose, UA: >=500 mg/dL — AB
Hgb urine dipstick: NEGATIVE
Ketones, ur: NEGATIVE mg/dL
Leukocytes,Ua: NEGATIVE
Nitrite: NEGATIVE
Protein, ur: 100 mg/dL — AB
Specific Gravity, Urine: 1.018 (ref 1.005–1.030)
pH: 8 (ref 5.0–8.0)

## 2019-04-05 LAB — I-STAT CHEM 8, ED
BUN: 12 mg/dL (ref 6–20)
Calcium, Ion: 1.19 mmol/L (ref 1.15–1.40)
Chloride: 95 mmol/L — ABNORMAL LOW (ref 98–111)
Creatinine, Ser: 0.5 mg/dL — ABNORMAL LOW (ref 0.61–1.24)
Glucose, Bld: 305 mg/dL — ABNORMAL HIGH (ref 70–99)
HCT: 36 % — ABNORMAL LOW (ref 39.0–52.0)
Hemoglobin: 12.2 g/dL — ABNORMAL LOW (ref 13.0–17.0)
Potassium: 4.3 mmol/L (ref 3.5–5.1)
Sodium: 134 mmol/L — ABNORMAL LOW (ref 135–145)
TCO2: 26 mmol/L (ref 22–32)

## 2019-04-05 LAB — CBG MONITORING, ED: Glucose-Capillary: 315 mg/dL — ABNORMAL HIGH (ref 70–99)

## 2019-04-05 LAB — LIPASE, BLOOD: Lipase: 40 U/L (ref 11–51)

## 2019-04-05 MED ORDER — ONDANSETRON HCL 4 MG/2ML IJ SOLN
4.0000 mg | Freq: Once | INTRAMUSCULAR | Status: AC
  Administered 2019-04-05: 4 mg via INTRAVENOUS
  Filled 2019-04-05: qty 2

## 2019-04-05 MED ORDER — ONDANSETRON 4 MG PO TBDP: 4.0000 mg | ORAL_TABLET | Freq: Three times a day (TID) | ORAL | 0 refills | Status: AC | PRN

## 2019-04-05 MED ORDER — SODIUM CHLORIDE 0.9 % IV BOLUS
1000.0000 mL | Freq: Once | INTRAVENOUS | Status: AC
  Administered 2019-04-05: 1000 mL via INTRAVENOUS

## 2019-04-05 MED ORDER — DOXYCYCLINE HYCLATE 100 MG PO CAPS: 100.0000 mg | ORAL_CAPSULE | Freq: Two times a day (BID) | ORAL | 0 refills | Status: AC

## 2019-04-05 NOTE — ED Provider Notes (Signed)
[facility] DEPT Provider Note   CSN: [id number] Arrival date & time: 04/05/19  0815     History   Chief Complaint Chief Complaint  Patient presents with  . Abdominal Pain  . Nausea  . Emesis  . Diarrhea    HPI Jake Howell is a 28 y.o. male with past medical history of type 1 diabetes, chronic gastroparesis, GERD, depression, presenting to the emergency department with complaint of 3 days of nausea vomiting and diarrhea.  Patient states he is unable to keep any meals down and is having some associated epigastric abdominal discomfort.  He states his symptoms feel a little bit worse than his history of gastroparesis.  He has been taking his insulin as prescribed though his sugars been running in the low 200s which is slightly above normal for him.  He denies associated fever, cough, urinary symptoms.  He treats his symptoms at home with Reglan though it has not been providing adequate relief.     The history is provided by the patient and medical records.    Past Medical History:  Diagnosis Date  . Depression   . Diabetes mellitus   . Gastroparesis   . Gastroparesis due to DM ([place]) 09/08/2015  . Neuropathy   . Scoliosis   . Vitamin D deficiency     Patient Active Problem List   Diagnosis Date Noted  . Diabetic gastroparesis ([place]) 02/26/2018  . Scoliosis 11/18/2015  . Gastroparesis due to DM ([place]) 09/08/2015  . Nausea & vomiting 08/07/2015  . Abdominal pain 07/29/2015  . Paresthesia of both feet 07/29/2015  . Insomnia 07/29/2015  . Generalized anxiety disorder 07/29/2015  . Elevated blood pressure 07/29/2015  . Suicidal ideation 01/27/2013  . Depression 02/18/2012  . Type 1 diabetes with neurological complication ([place]) 16/60/6301  . Abnormal presence of protein in urine 12/31/2011  . Avitaminosis D 12/31/2011    History reviewed. No pertinent surgical history.      Home Medications    Prior to Admission medications   Medication  Sig Start Date End Date Taking? Authorizing Provider  AMBULATORY NON FORMULARY MEDICATION Test strips for 4x daily testing.  One touch ultra. E10.40 uncontrolled DM1 01/09/18   Gregor Hams, MD  dicyclomine (BENTYL) 20 MG tablet Take 1 tablet (20 mg total) by mouth 3 (three) times daily before meals for 20 days. 02/28/18 03/20/18  Purohit, Konrad Dolores, MD  doxycycline (VIBRAMYCIN) 100 MG capsule Take 1 capsule (100 mg total) by mouth 2 (two) times daily. 04/05/19   Lawyer, Harrell Gave, PA-C  famotidine (PEPCID) 20 MG tablet Take 1 tablet (20 mg total) by mouth 2 (two) times daily. 02/28/18 03/30/18  Purohit, Konrad Dolores, MD  insulin aspart (NOVOLOG FLEXPEN) 100 UNIT/ML FlexPen SSI 3x daily with meals Patient taking differently: Inject 0-10 Units into the skin 3 (three) times daily with meals. SSI 3x daily with meals 01/09/18   Gregor Hams, MD  insulin degludec (TRESIBA FLEXTOUCH) 100 UNIT/ML SOPN FlexTouch Pen Inject 0.3 mLs (30 Units total) into the skin daily. 01/09/18   Gregor Hams, MD  Insulin Pen Needle (ASSURE ID SAFETY PEN NEEDLES) 31G X 5 MM MISC 1 each by Does not apply route 4 (four) times daily. 01/09/18   Gregor Hams, MD  metoCLOPramide (REGLAN) 10 MG tablet Take 1 tablet (10 mg total) by mouth every 6 (six) hours as needed for up to 14 days for nausea or vomiting. 02/28/18 03/14/18  Purohit, Konrad Dolores, MD  mirtazapine (  REMERON) 7.5 MG tablet Take 1 tablet (7.5 mg total) by mouth at bedtime. 01/09/18   Rodolph Bong, MD  ondansetron (ZOFRAN ODT) 4 MG disintegrating tablet Take 1 tablet (4 mg total) by mouth every 8 (eight) hours as needed for nausea or vomiting. 04/05/19   Eusebia Grulke, [place] N, PA-C    Family History Family History  Problem Relation Age of Onset  . Crohn's disease Father   . Stroke Maternal Uncle   . Cancer Paternal Grandmother   . Diabetes Paternal Grandmother     Social History Social History   Tobacco Use  . Smoking status: Former Smoker    Packs/day: 0.50    Years: 1.00     Pack years: 0.50  . Smokeless tobacco: Never Used  Substance Use Topics  . Alcohol use: No  . Drug use: No     Allergies   Patient has no known allergies.   Review of Systems Review of Systems  Gastrointestinal: Positive for abdominal pain, diarrhea, nausea and vomiting.  All other systems reviewed and are negative.    Physical Exam Updated Vital Signs BP (!) 154/102   Pulse 77   Temp 98.9 F (37.2 C) (Oral)   Resp 18   Ht 5\' 9"  (1.753 m)   Wt 52.2 kg   SpO2 100%   BMI 16.98 kg/m   Physical Exam Vitals signs and nursing note reviewed.  Constitutional:      General: He is not in acute distress.    Appearance: He is well-developed.  HENT:     Head: Normocephalic and atraumatic.  Eyes:     Conjunctiva/sclera: Conjunctivae normal.  Cardiovascular:     Rate and Rhythm: Normal rate and regular rhythm.  Pulmonary:     Effort: Pulmonary effort is normal. No respiratory distress.     Breath sounds: Normal breath sounds.  Abdominal:     General: Abdomen is flat. Bowel sounds are normal.     Palpations: Abdomen is soft.     Tenderness: There is generalized abdominal tenderness. There is no guarding or rebound.  Skin:    General: Skin is warm.  Neurological:     Mental Status: He is alert.  Psychiatric:        Behavior: Behavior normal.      ED Treatments / Results  Labs (all labs ordered are listed, but only abnormal results are displayed) Labs Reviewed  CBC WITH DIFFERENTIAL/PLATELET - Abnormal; Notable for the following components:      Result Value   RBC 4.09 (*)    Hemoglobin 11.9 (*)    HCT 35.8 (*)    All other components within normal limits  COMPREHENSIVE METABOLIC PANEL - Abnormal; Notable for the following components:   Sodium 132 (*)    Chloride 94 (*)    Glucose, Bld 311 (*)    Creatinine, Ser 0.52 (*)    Calcium 8.3 (*)    Total Protein 6.2 (*)    Albumin 3.2 (*)    All other components within normal limits  URINALYSIS, ROUTINE W REFLEX  MICROSCOPIC - Abnormal; Notable for the following components:   APPearance CLOUDY (*)    Glucose, UA >=500 (*)    Protein, ur 100 (*)    Bacteria, UA RARE (*)    All other components within normal limits  CBG MONITORING, ED - Abnormal; Notable for the following components:   Glucose-Capillary 315 (*)    All other components within normal limits  I-STAT CHEM 8, ED -  Abnormal; Notable for the following components:   Sodium 134 (*)    Chloride 95 (*)    Creatinine, Ser 0.50 (*)    Glucose, Bld 305 (*)    Hemoglobin 12.2 (*)    HCT 36.0 (*)    All other components within normal limits  LIPASE, BLOOD    EKG None  Radiology No results found.  Procedures Procedures (including critical care time)  Medications Ordered in ED Medications  ondansetron (ZOFRAN) injection 4 mg (4 mg Intravenous Given 04/05/19 1119)  sodium chloride 0.9 % bolus 1,000 mL (0 mLs Intravenous Stopped 04/05/19 1237)     Initial Impression / Assessment and Plan / ED Course  I have reviewed the triage vital signs and the nursing notes.  Pertinent labs & imaging results that were available during my care of the patient were reviewed by me and considered in my medical decision making (see chart for details).  Clinical Course as of Apr 04 1500  Thu Apr 05, 2019  1229 Patient reevaluated.  Reports improvement in symptoms.  Discussed reassuring lab results.  Will p.o. challenge with anticipated discharge.   [JR]    Clinical Course User Index [JR] Jaydence Vanyo, [place] N, PA-C       Patient is a type I diabetic with history of chronic gastroparesis, presenting to the emergency department with nausea and vomiting began a few days ago.  His abdomen has generalized tenderness though no guarding or rebound.  He is overall well-appearing and in no distress.  Low suspicion for acute intra-abdominal process.  No leukocytosis.  Labs reveal hyperglycemia around 300 though no signs of DKA.  He was treated with IV fluids and  antiemetics with improvement in symptoms.  Patient is tolerating p.o. fluids and ready for discharge.  Will send with prescription for Zofran for patient's request.  Encourage good p.o. hydration.  Encourage close outpatient follow-up and strict return precautions. Pt agreeable to plan and safe for d.c.  Discussed results, findings, treatment and follow up. Patient advised of return precautions. Patient verbalized understanding and agreed with plan.   Final Clinical Impressions(s) / ED Diagnoses   Final diagnoses:  Hyperglycemia due to type 1 diabetes mellitus (H[facility])  Non-intractable vomiting with nausea, unspecified vomiting type    ED Discharge Orders         Ordered    ondansetron (ZOFRAN ODT) 4 MG disintegrating tablet  Every 8 hours PRN     04/05/19 1354    doxycycline (VIBRAMYCIN) 100 MG capsule  2 times daily     04/05/19 1412           Samora Jernberg, [place] N, [place] JerseyPA-C 04/05/19 1503    Alvira MondaySchlossman, Erin, MD 04/05/19 2323

## 2019-04-05 NOTE — ED Triage Notes (Signed)
Pt states past 4 days abdominal pain, N/V/D and fatigue, states Hx of gastroparesis. Unable to eat solid food for several days. Pt type 1 diabetic.

## 2019-04-05 NOTE — Discharge Instructions (Addendum)
Continue monitoring your blood sugar and taking your insulin as prescribed. It is important that you stay hydrated. You can use the Zofran every 8 hours as needed for nausea and vomiting if the Reglan is not helping. Follow up closely with your primary care provider.

## 2019-04-09 ENCOUNTER — Encounter (HOSPITAL_COMMUNITY): Payer: Self-pay | Admitting: Emergency Medicine

## 2019-04-09 DIAGNOSIS — E1043 Type 1 diabetes mellitus with diabetic autonomic (poly)neuropathy: Secondary | ICD-10-CM | POA: Diagnosis not present

## 2019-04-09 DIAGNOSIS — Z79899 Other long term (current) drug therapy: Secondary | ICD-10-CM | POA: Insufficient documentation

## 2019-04-09 DIAGNOSIS — Z87891 Personal history of nicotine dependence: Secondary | ICD-10-CM | POA: Diagnosis not present

## 2019-04-09 DIAGNOSIS — R112 Nausea with vomiting, unspecified: Secondary | ICD-10-CM | POA: Diagnosis present

## 2019-04-09 LAB — CBG MONITORING, ED: Glucose-Capillary: 122 mg/dL — ABNORMAL HIGH (ref 70–99)

## 2019-04-09 MED ORDER — SODIUM CHLORIDE 0.9% FLUSH
3.0000 mL | Freq: Once | INTRAVENOUS | Status: AC
  Administered 2019-04-10: 3 mL via INTRAVENOUS

## 2019-04-09 MED ORDER — ONDANSETRON 4 MG PO TBDP
4.0000 mg | ORAL_TABLET | Freq: Once | ORAL | Status: AC | PRN
  Administered 2019-04-09: 19:00:00 4 mg via ORAL
  Filled 2019-04-09: qty 1

## 2019-04-09 NOTE — ED Triage Notes (Signed)
Patient here from home with complaints of abd pain, n/v/d. Hx of same. Unable to keep anything down. Type 1 diabetic.

## 2019-04-10 ENCOUNTER — Emergency Department (HOSPITAL_COMMUNITY)
Admission: EM | Admit: 2019-04-10 | Discharge: 2019-04-10 | Disposition: A | Discharge status: Home / Self Care | Payer: Medicaid Other | Attending: Emergency Medicine | Admitting: Emergency Medicine

## 2019-04-10 DIAGNOSIS — K3184 Gastroparesis: Secondary | ICD-10-CM

## 2019-04-10 LAB — COMPREHENSIVE METABOLIC PANEL
ALT: 54 U/L — ABNORMAL HIGH (ref 0–44)
AST: 44 U/L — ABNORMAL HIGH (ref 15–41)
Albumin: 3.5 g/dL (ref 3.5–5.0)
Alkaline Phosphatase: 121 U/L (ref 38–126)
Anion gap: 13 (ref 5–15)
BUN: 16 mg/dL (ref 6–20)
CO2: 28 mmol/L (ref 22–32)
Calcium: 9.2 mg/dL (ref 8.9–10.3)
Chloride: 96 mmol/L — ABNORMAL LOW (ref 98–111)
Creatinine, Ser: 0.68 mg/dL (ref 0.61–1.24)
GFR calc Af Amer: >60 mL/min (ref >60)
GFR calc non Af Amer: >60 mL/min (ref >60)
Glucose, Bld: 110 mg/dL — ABNORMAL HIGH (ref 70–99)
Potassium: 3.7 mmol/L (ref 3.5–5.1)
Sodium: 137 mmol/L (ref 135–145)
Total Bilirubin: 0.9 mg/dL (ref 0.3–1.2)
Total Protein: 7.4 g/dL (ref 6.5–8.1)

## 2019-04-10 LAB — CBC
HCT: 43.2 % (ref 39.0–52.0)
Hemoglobin: 14.4 g/dL (ref 13.0–17.0)
MCH: 28.5 pg (ref 26.0–34.0)
MCHC: 33.3 g/dL (ref 30.0–36.0)
MCV: 85.4 fL (ref 80.0–100.0)
Platelets: 419 10*3/uL — ABNORMAL HIGH (ref 150–400)
RBC: 5.06 MIL/uL (ref 4.22–5.81)
RDW: 13.2 % (ref 11.5–15.5)
WBC: 8 10*3/uL (ref 4.0–10.5)
nRBC: 0 % (ref 0.0–0.2)

## 2019-04-10 LAB — LIPASE, BLOOD: Lipase: 17 U/L (ref 11–51)

## 2019-04-10 MED ORDER — HALOPERIDOL LACTATE 5 MG/ML IJ SOLN
2.0000 mg | Freq: Once | INTRAMUSCULAR | Status: AC
  Administered 2019-04-10: 03:00:00 2 mg via INTRAVENOUS
  Filled 2019-04-10: qty 1

## 2019-04-10 MED ORDER — METOCLOPRAMIDE HCL 10 MG PO TABS: 10.0000 mg | ORAL_TABLET | Freq: Four times a day (QID) | ORAL | 1 refills | Status: AC | PRN

## 2019-04-10 MED ORDER — SODIUM CHLORIDE 0.9 % IV BOLUS
1000.0000 mL | Freq: Once | INTRAVENOUS | Status: AC
  Administered 2019-04-10: 1000 mL via INTRAVENOUS

## 2019-04-10 NOTE — ED Notes (Signed)
Pt provided with Water for PO challenge.

## 2019-04-10 NOTE — ED Provider Notes (Signed)
[facility]-EMERGENCY DEPT Provider Note   CSN: [id number] Arrival date & time: 04/09/19  1914     History   Chief Complaint Chief Complaint  Patient presents with  . Nausea  . Emesis    HPI Jake Howell is a 28 y.o. male.     HPI  This is a 28 year old male with a history of diabetes, gastroparesis who presents with nausea, vomiting, and abdominal pain.  Patient reports recent history of the same and he was seen in the emergency department on Thursday of last week.  He states he is unable to keep any meals down.  He is taking Zofran without relief.  He also reports constant upper abdominal discomfort that is nonradiating.  He rates his pain 8 out of 10.  He reports that his blood sugars run in the low 200s.  He denies any fever.  He has not had a bowel movement in 2 days.  Denies any cough or upper respiratory symptoms.  Past Medical History:  Diagnosis Date  . Depression   . Diabetes mellitus   . Gastroparesis   . Gastroparesis due to DM ([facility]) 09/08/2015  . Neuropathy   . Scoliosis   . Vitamin D deficiency     Patient Active Problem List   Diagnosis Date Noted  . Diabetic gastroparesis ([facility]) 02/26/2018  . Scoliosis 11/18/2015  . Gastroparesis due to DM ([facility]) 09/08/2015  . Nausea & vomiting 08/07/2015  . Abdominal pain 07/29/2015  . Paresthesia of both feet 07/29/2015  . Insomnia 07/29/2015  . Generalized anxiety disorder 07/29/2015  . Elevated blood pressure 07/29/2015  . Suicidal ideation 01/27/2013  . Depression 02/18/2012  . Type 1 diabetes with neurological complication ([facility]) 02/16/2012  . Abnormal presence of protein in urine 12/31/2011  . Avitaminosis D 12/31/2011    History reviewed. No pertinent surgical history.      Home Medications    Prior to Admission medications   Medication Sig Start Date End Date Taking? Authorizing Provider  doxycycline (VIBRAMYCIN) 100 MG capsule Take 1 capsule (100 mg total) by mouth 2 (two) times  daily. 04/05/19  Yes Lawyer, Cristal Deer, PA-C  insulin aspart (NOVOLOG FLEXPEN) 100 UNIT/ML FlexPen SSI 3x daily with meals Patient taking differently: Inject 0-10 Units into the skin 3 (three) times daily with meals. SSI 3x daily with meals 01/09/18  Yes Rodolph Bong, MD  Insulin Detemir (LEVEMIR) 100 UNIT/ML Pen Inject 28 Units into the skin daily. 03/17/19  Yes [provider]  ondansetron (ZOFRAN ODT) 4 MG disintegrating tablet Take 1 tablet (4 mg total) by mouth every 8 (eight) hours as needed for nausea or vomiting. 04/05/19  Yes Roxan Hockey, [place] N, PA-C  AMBULATORY NON FORMULARY MEDICATION Test strips for 4x daily testing.  One touch ultra. E10.40 uncontrolled DM1 01/09/18   Rodolph Bong, MD  dicyclomine (BENTYL) 20 MG tablet Take 1 tablet (20 mg total) by mouth 3 (three) times daily before meals for 20 days. Patient not taking: Reported on 04/10/2019 02/28/18 03/20/18  Purohit, Salli Quarry, MD  famotidine (PEPCID) 20 MG tablet Take 1 tablet (20 mg total) by mouth 2 (two) times daily. Patient not taking: Reported on 04/10/2019 02/28/18 03/30/18  Purohit, Salli Quarry, MD  insulin degludec (TRESIBA FLEXTOUCH) 100 UNIT/ML SOPN FlexTouch Pen Inject 0.3 mLs (30 Units total) into the skin daily. Patient not taking: Reported on 04/10/2019 01/09/18   Rodolph Bong, MD  Insulin Pen Needle (ASSURE ID SAFETY PEN NEEDLES) 31G X 5 MM  MISC 1 each by Does not apply route 4 (four) times daily. 01/09/18   Gregor Hams, MD  metoCLOPramide (REGLAN) 10 MG tablet Take 1 tablet (10 mg total) by mouth every 6 (six) hours as needed for up to 14 days for nausea or vomiting. 04/10/19 04/24/19  Jove Beyl, Barbette Hair, MD  mirtazapine (REMERON) 7.5 MG tablet Take 1 tablet (7.5 mg total) by mouth at bedtime. Patient not taking: Reported on 04/10/2019 01/09/18   Gregor Hams, MD    Family History Family History  Problem Relation Age of Onset  . Crohn's disease Father   . Stroke Maternal Uncle   . Cancer Paternal Grandmother    . Diabetes Paternal Grandmother     Social History Social History   Tobacco Use  . Smoking status: Former Smoker    Packs/day: 0.50    Years: 1.00    Pack years: 0.50  . Smokeless tobacco: Never Used  Substance Use Topics  . Alcohol use: No  . Drug use: No     Allergies   Patient has no known allergies.   Review of Systems Review of Systems  Constitutional: Negative for fever.  Respiratory: Negative for shortness of breath.   Cardiovascular: Negative for chest pain.  Gastrointestinal: Positive for abdominal pain, nausea and vomiting. Negative for constipation and diarrhea.  Genitourinary: Negative for dysuria.  Musculoskeletal: Negative for back pain.  All other systems reviewed and are negative.    Physical Exam Updated Vital Signs BP (!) 134/98   Pulse 84   Temp 98.3 F (36.8 C) (Oral)   Resp 20   SpO2 98%   Physical Exam Vitals signs and nursing note reviewed.  Constitutional:      Appearance: He is well-developed.     Comments: Thin, chronically ill-appearing  HENT:     Head: Normocephalic and atraumatic.  Eyes:     Pupils: Pupils are equal, round, and reactive to light.  Neck:     Musculoskeletal: Neck supple.  Cardiovascular:     Rate and Rhythm: Regular rhythm. Tachycardia present.     Heart sounds: Normal heart sounds. No murmur.  Pulmonary:     Effort: Pulmonary effort is normal. No respiratory distress.     Breath sounds: Normal breath sounds. No wheezing.  Abdominal:     General: Bowel sounds are normal.     Palpations: Abdomen is soft.     Tenderness: There is no abdominal tenderness. There is no guarding or rebound.  Lymphadenopathy:     Cervical: No cervical adenopathy.  Skin:    General: Skin is warm and dry.  Neurological:     Mental Status: He is alert and oriented to person, place, and time.  Psychiatric:        Mood and Affect: Mood normal.      ED Treatments / Results  Labs (all labs ordered are listed, but only  abnormal results are displayed) Labs Reviewed  COMPREHENSIVE METABOLIC PANEL - Abnormal; Notable for the following components:      Result Value   Chloride 96 (*)    Glucose, Bld 110 (*)    AST 44 (*)    ALT 54 (*)    All other components within normal limits  CBC - Abnormal; Notable for the following components:   Platelets 419 (*)    All other components within normal limits  CBG MONITORING, ED - Abnormal; Notable for the following components:   Glucose-Capillary 122 (*)    All other components within  normal limits  LIPASE, BLOOD  URINALYSIS, ROUTINE W REFLEX MICROSCOPIC  CBG MONITORING, ED    EKG EKG Interpretation  Date/Time:  Tuesday April 10 2019 02:36:23 EDT Ventricular Rate:  98 PR Interval:    QRS Duration: 79 QT Interval:  351 QTC Calculation: 449 R Axis:   70 Text Interpretation:  Sinus rhythm Confirmed by Ross MarcusHorton, Charlis Harner ([id number]) on 04/10/2019 3:21:35 AM   Radiology No results found.  Procedures Procedures (including critical care time)  Medications Ordered in ED Medications  sodium chloride flush (NS) 0.9 % injection 3 mL (3 mLs Intravenous Given 04/10/19 0239)  ondansetron (ZOFRAN-ODT) disintegrating tablet 4 mg (4 mg Oral Given 04/09/19 1929)  sodium chloride 0.9 % bolus 1,000 mL (0 mLs Intravenous Stopped 04/10/19 0335)  haloperidol lactate (HALDOL) injection 2 mg (2 mg Intravenous Given 04/10/19 0238)     Initial Impression / Assessment and Plan / ED Course  I have reviewed the triage vital signs and the nursing notes.  Pertinent labs & imaging results that were available during my care of the patient were reviewed by me and considered in my medical decision making (see chart for details).  Clinical Course as of Apr 10 515  Tue Apr 10, 2019  [id number] Patient has had no recurrent emesis while in the department.  He was given fluids and Haldol.  He was able to tolerate fluids.  He is out of his Reglan at home.  Will refill this prescription.  I  discussed with him he is to follow-up with his gastroenterologist.   [CH]    Clinical Course User Index [CH] Johnthan Axtman, Mayer Maskerourtney F, MD       Patient presents with nausea, vomiting, abdominal pain.  He is overall nontoxic and vital signs are reassuring.  He has chronic gastroparesis.  No significant tenderness on exam.  Suspect this is likely related to his known gastroparesis.  Patient was given fluids and Haldol.  No evidence of DKA on basic lab work.  He does have some mild hypochloremia likely related to vomiting.  On multiple rechecks he is resting comfortably.  Will refill his Reglan and have him follow-up with his primary gastroenterologist.  After history, exam, and medical workup I feel the patient has been appropriately medically screened and is safe for discharge home. Pertinent diagnoses were discussed with the patient. Patient was given return precautions.   Final Clinical Impressions(s) / ED Diagnoses   Final diagnoses:  Gastroparesis    ED Discharge Orders         Ordered    metoCLOPramide (REGLAN) 10 MG tablet  Every 6 hours PRN     04/10/19 0515           Shon BatonHorton, Tylee Yum F, MD 04/10/19 [phone]

## 2019-04-10 NOTE — Discharge Instructions (Addendum)
You were seen today for nausea, vomiting, abdominal pain.  This is likely related to your known gastroparesis.  Continue Reglan.  Make sure to drink lots of fluids.

## 2019-06-07 IMAGING — US US ABDOMEN LIMITED
1 series · 14 of 25 positions shown · non-contrast
Comparison: None

CLINICAL DATA: 27-year-old male with abnormal liver function test.

EXAM:
ULTRASOUND ABDOMEN LIMITED RIGHT UPPER QUADRANT

[Series 1: us abdomen limited · 14 of 48 slices shown]
[im 1/48]
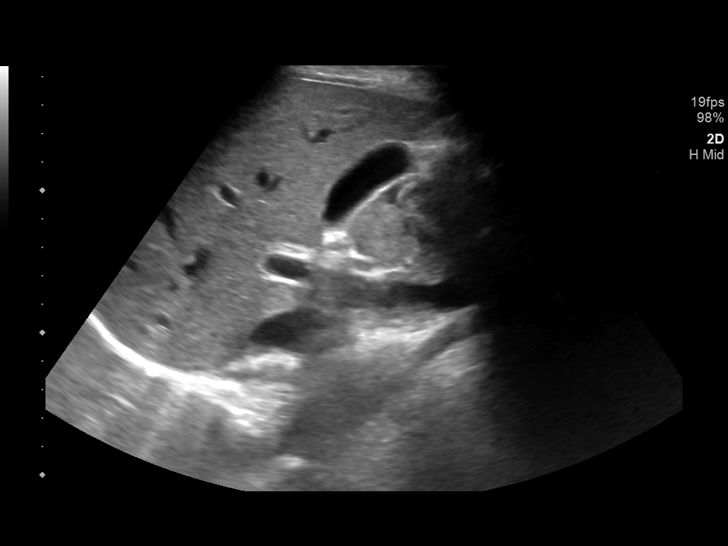
[im 4/48]
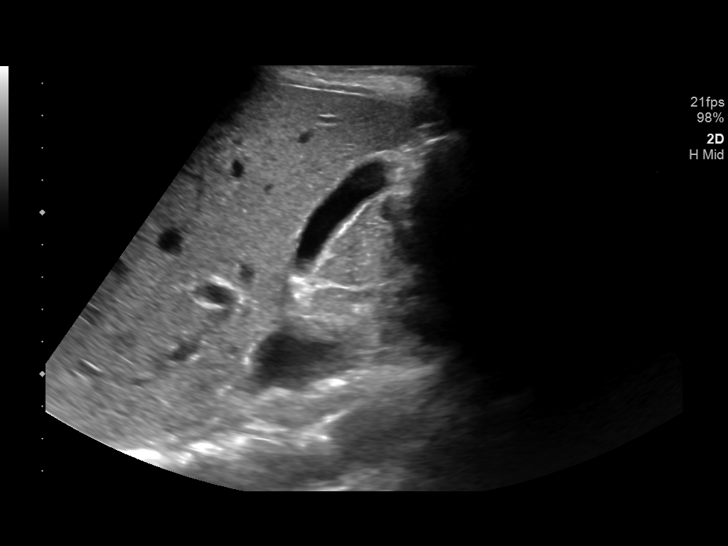
[im 8/48]
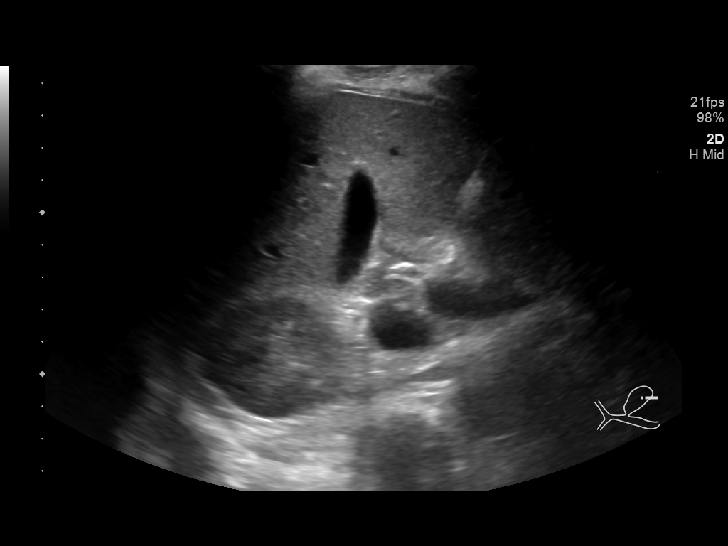
[im 12/48]
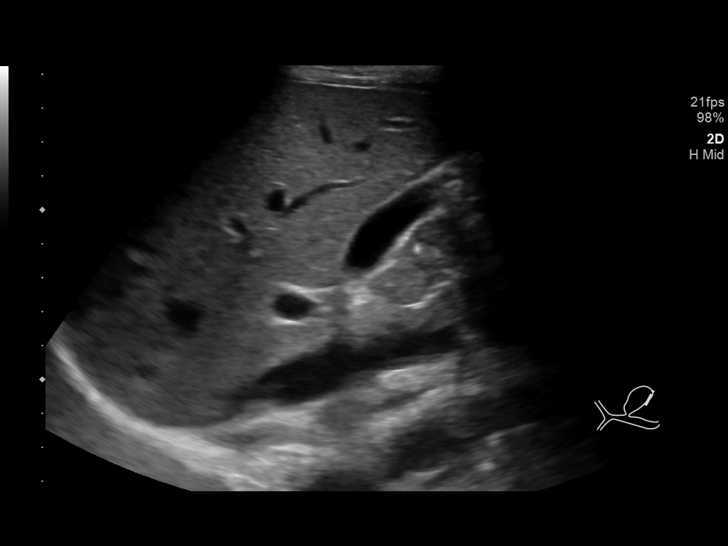
[im 16/48]
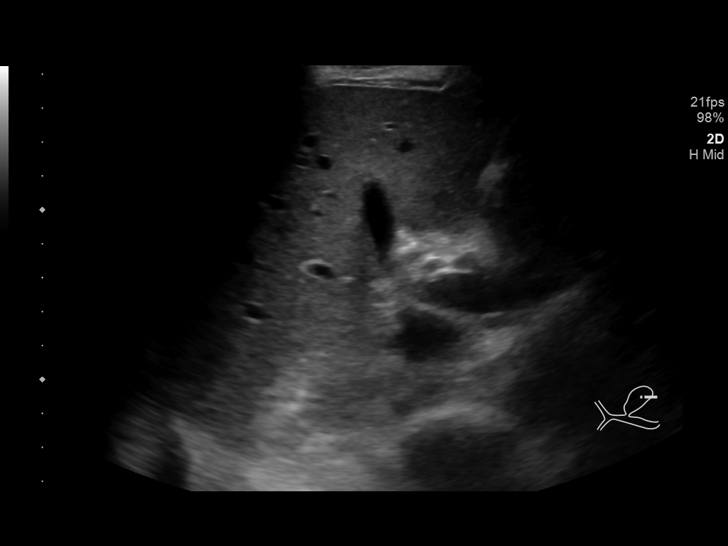
[im 18/48]
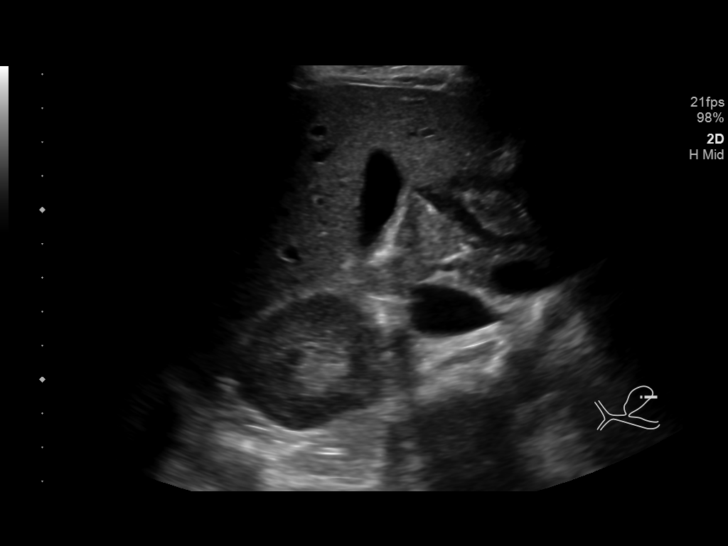
[im 22/48]
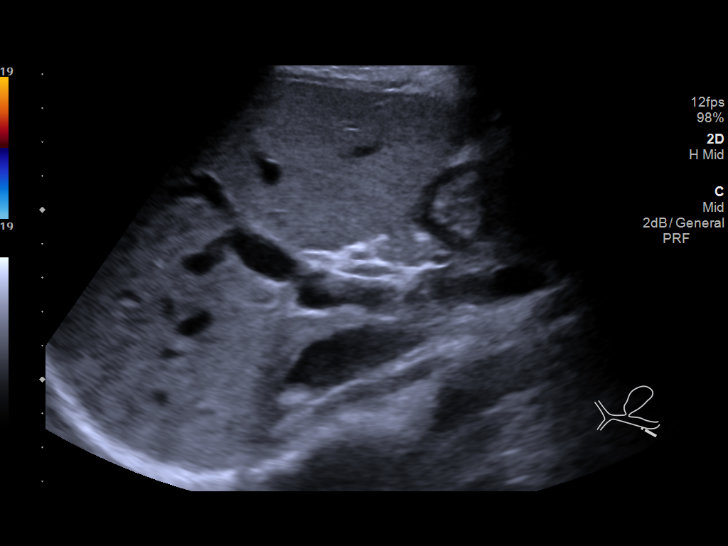
[im 26/48]
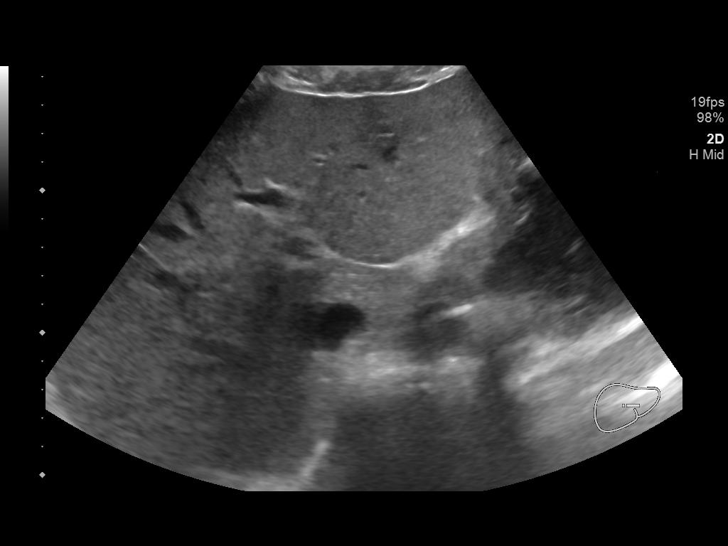
[im 30/48]
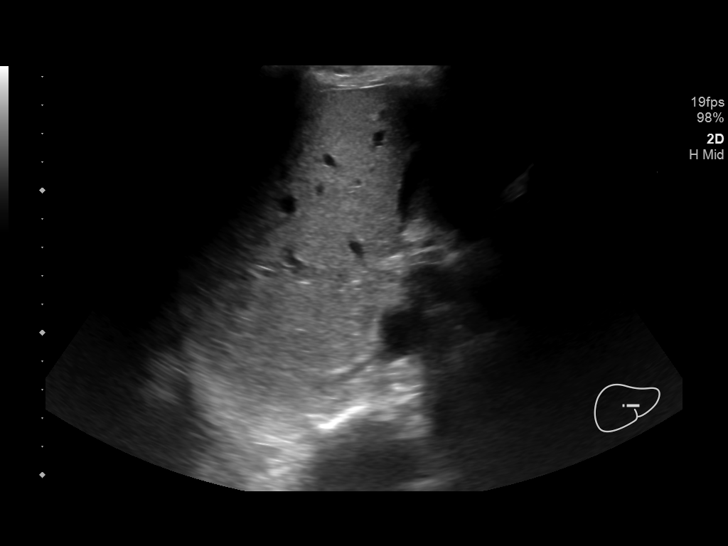
[im 32/48]
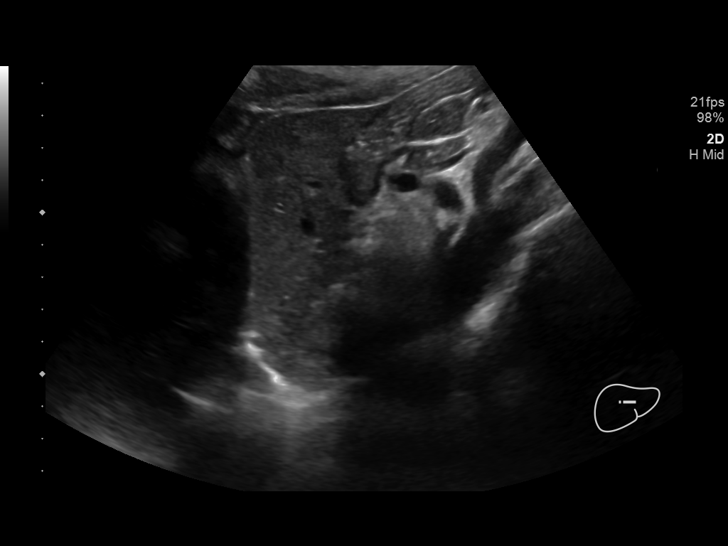
[im 36/48]
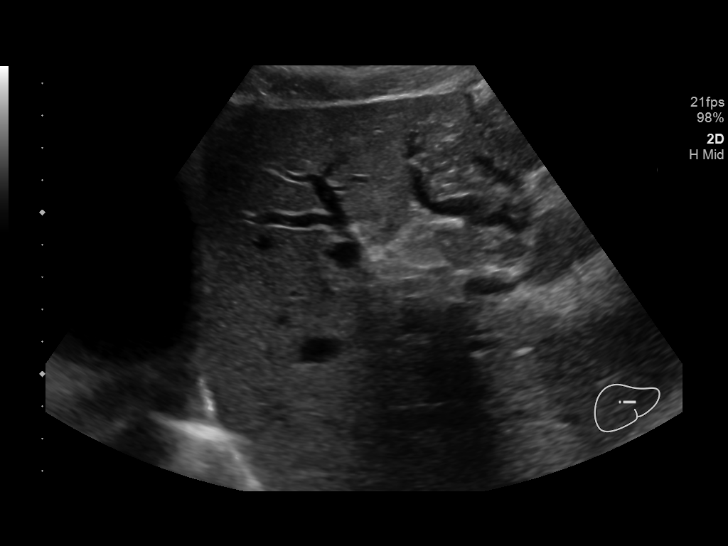
[im 40/48]
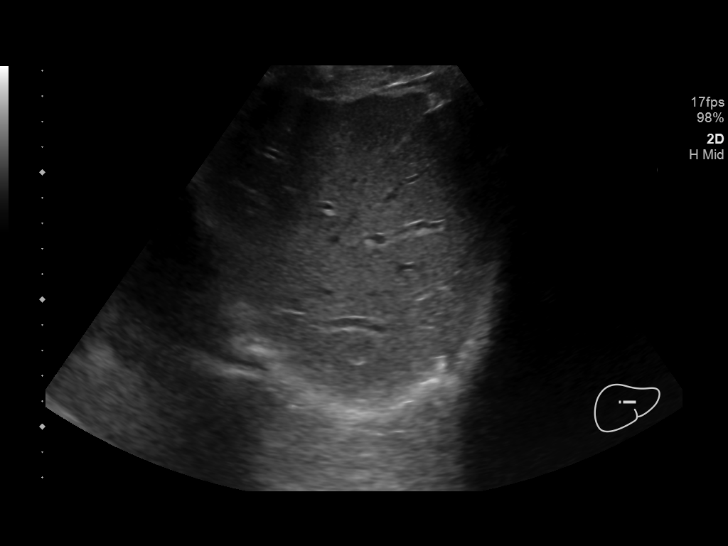
[im 44/48]
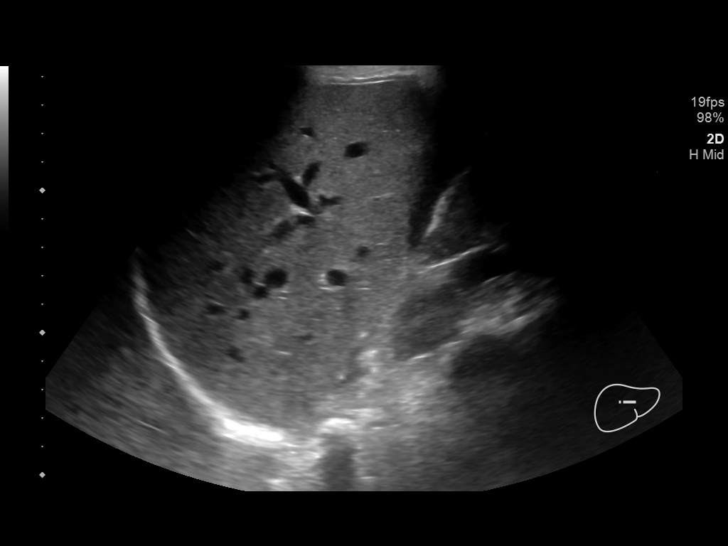
[im 48/48]
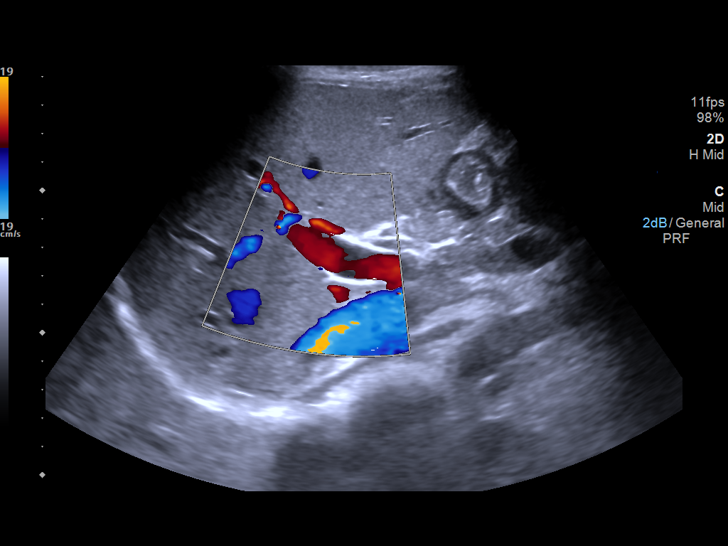

[14 of 25 positions shown; findings below may reference images not displayed]

FINDINGS: Gallbladder:

No gallstones or wall thickening visualized. No sonographic Murphy
sign noted by sonographer.

Common bile duct:

Diameter: 3 mm

Liver:

No focal lesion identified. Within normal limits in parenchymal
echogenicity. Portal vein is patent on color Doppler imaging with
normal direction of blood flow towards the liver.
IMPRESSION: Unremarkable right upper quadrant ultrasound.

## 2019-07-07 ENCOUNTER — Other Ambulatory Visit: Payer: Self-pay

## 2019-07-07 ENCOUNTER — Emergency Department (HOSPITAL_COMMUNITY)
Admission: EM | Admit: 2019-07-07 | Discharge: 2019-07-07 | Disposition: A | Discharge status: Home / Self Care | Payer: Medicaid Other | Attending: Emergency Medicine | Admitting: Emergency Medicine

## 2019-07-07 ENCOUNTER — Encounter (HOSPITAL_COMMUNITY): Payer: Self-pay | Admitting: Emergency Medicine

## 2019-07-07 DIAGNOSIS — E109 Type 1 diabetes mellitus without complications: Secondary | ICD-10-CM | POA: Diagnosis not present

## 2019-07-07 DIAGNOSIS — Z87891 Personal history of nicotine dependence: Secondary | ICD-10-CM | POA: Diagnosis not present

## 2019-07-07 DIAGNOSIS — R1084 Generalized abdominal pain: Secondary | ICD-10-CM

## 2019-07-07 DIAGNOSIS — Z79899 Other long term (current) drug therapy: Secondary | ICD-10-CM | POA: Diagnosis not present

## 2019-07-07 DIAGNOSIS — E860 Dehydration: Secondary | ICD-10-CM

## 2019-07-07 LAB — URINALYSIS, ROUTINE W REFLEX MICROSCOPIC
Bacteria, UA: NONE SEEN
Bilirubin Urine: NEGATIVE
Glucose, UA: >=500 mg/dL — AB
Hgb urine dipstick: NEGATIVE
Ketones, ur: 5 mg/dL — AB
Leukocytes,Ua: NEGATIVE
Nitrite: NEGATIVE
Protein, ur: >=300 mg/dL — AB
Specific Gravity, Urine: 1.023 (ref 1.005–1.030)
pH: 8 (ref 5.0–8.0)

## 2019-07-07 LAB — LIPASE, BLOOD: Lipase: 18 U/L (ref 11–51)

## 2019-07-07 LAB — CBC
HCT: 43 % (ref 39.0–52.0)
Hemoglobin: 14.6 g/dL (ref 13.0–17.0)
MCH: 28.4 pg (ref 26.0–34.0)
MCHC: 34 g/dL (ref 30.0–36.0)
MCV: 83.7 fL (ref 80.0–100.0)
Platelets: 426 10*3/uL — ABNORMAL HIGH (ref 150–400)
RBC: 5.14 MIL/uL (ref 4.22–5.81)
RDW: 13.2 % (ref 11.5–15.5)
WBC: 11.6 10*3/uL — ABNORMAL HIGH (ref 4.0–10.5)
nRBC: 0 % (ref 0.0–0.2)

## 2019-07-07 LAB — COMPREHENSIVE METABOLIC PANEL
ALT: 36 U/L (ref 0–44)
AST: 22 U/L (ref 15–41)
Albumin: 3.6 g/dL (ref 3.5–5.0)
Alkaline Phosphatase: 102 U/L (ref 38–126)
Anion gap: 15 (ref 5–15)
BUN: 23 mg/dL — ABNORMAL HIGH (ref 6–20)
CO2: 37 mmol/L — ABNORMAL HIGH (ref 22–32)
Calcium: 9.2 mg/dL (ref 8.9–10.3)
Chloride: 80 mmol/L — ABNORMAL LOW (ref 98–111)
Creatinine, Ser: 0.87 mg/dL (ref 0.61–1.24)
GFR calc Af Amer: >60 mL/min (ref >60)
GFR calc non Af Amer: >60 mL/min (ref >60)
Glucose, Bld: 233 mg/dL — ABNORMAL HIGH (ref 70–99)
Potassium: 2.9 mmol/L — ABNORMAL LOW (ref 3.5–5.1)
Sodium: 132 mmol/L — ABNORMAL LOW (ref 135–145)
Total Bilirubin: 1.1 mg/dL (ref 0.3–1.2)
Total Protein: 7.1 g/dL (ref 6.5–8.1)

## 2019-07-07 MED ORDER — SODIUM CHLORIDE 0.9% FLUSH: 3.0000 mL | Freq: Once | INTRAVENOUS | Status: DC

## 2019-07-07 MED ORDER — POTASSIUM CHLORIDE IN NACL 20-0.9 MEQ/L-% IV SOLN
Freq: Once | INTRAVENOUS | Status: AC
  Administered 2019-07-07: 500 mL/h via INTRAVENOUS
  Filled 2019-07-07: qty 1000

## 2019-07-07 MED ORDER — HALOPERIDOL LACTATE 5 MG/ML IJ SOLN
2.0000 mg | Freq: Once | INTRAMUSCULAR | Status: AC
  Administered 2019-07-07: 2 mg via INTRAVENOUS
  Filled 2019-07-07: qty 1

## 2019-07-07 MED ORDER — FAMOTIDINE IN NACL 20-0.9 MG/50ML-% IV SOLN
20.0000 mg | Freq: Once | INTRAVENOUS | Status: AC
  Administered 2019-07-07: 20 mg via INTRAVENOUS
  Filled 2019-07-07: qty 50

## 2019-07-07 NOTE — Discharge Instructions (Signed)
As discussed, your evaluation today has been largely reassuring.  But, it is important that you monitor your condition carefully, and do not hesitate to return to the ED if you develop new, or concerning changes in your condition. ? ?Otherwise, please follow-up with your physician for appropriate ongoing care. ? ?

## 2019-07-07 NOTE — ED Notes (Signed)
Urine culture sent with urinalysis.  

## 2019-07-07 NOTE — ED Triage Notes (Signed)
Pt to triage via [facility] for mid upper abd pain x 2 days with nausea and vomiting.

## 2019-07-07 NOTE — ED Provider Notes (Signed)
[facility] Provider Note   CSN: [id number] Arrival date & time: 07/07/19  1737     History Chief Complaint  Patient presents with  . Abdominal Pain    Jake Howell is a 29 y.o. male.  HPI    Young male with a history of insulin-dependent diabetes, gastroparesis presents with abdominal pain, nausea, vomiting, weakness. Onset was yesterday, without clear precipitant. Since onset pain has become more severe, is focally in the midline upper abdomen, lower chest. There is associated nausea, anorexia, and as above he has had multiple episodes of vomiting. No diarrhea, no fever. No ability to take any medication for relief. Past Medical History:  Diagnosis Date  . Depression   . Diabetes mellitus   . Gastroparesis   . Gastroparesis due to DM ([place]) 09/08/2015  . Neuropathy   . Scoliosis   . Vitamin D deficiency     Patient Active Problem List   Diagnosis Date Noted  . Diabetic gastroparesis ([place]) 02/26/2018  . Scoliosis 11/18/2015  . Gastroparesis due to DM ([place]) 09/08/2015  . Nausea & vomiting 08/07/2015  . Abdominal pain 07/29/2015  . Paresthesia of both feet 07/29/2015  . Insomnia 07/29/2015  . Generalized anxiety disorder 07/29/2015  . Elevated blood pressure 07/29/2015  . Suicidal ideation 01/27/2013  . Depression 02/18/2012  . Type 1 diabetes with neurological complication ([place]) 40/34/7425  . Abnormal presence of protein in urine 12/31/2011  . Avitaminosis D 12/31/2011    History reviewed. No pertinent surgical history.     Family History  Problem Relation Age of Onset  . Crohn's disease Father   . Stroke Maternal Uncle   . Cancer Paternal Grandmother   . Diabetes Paternal Grandmother     Social History   Tobacco Use  . Smoking status: Former Smoker    Packs/day: 0.50    Years: 1.00    Pack years: 0.50  . Smokeless tobacco: Never Used  Substance Use Topics  . Alcohol use: No  . Drug use: No    Home  Medications Prior to Admission medications   Medication Sig Start Date End Date Taking? Authorizing Provider  AMBULATORY NON FORMULARY MEDICATION Test strips for 4x daily testing.  One touch ultra. E10.40 uncontrolled DM1 01/09/18   Gregor Hams, MD  dicyclomine (BENTYL) 20 MG tablet Take 1 tablet (20 mg total) by mouth 3 (three) times daily before meals for 20 days. Patient not taking: Reported on 04/10/2019 02/28/18 03/20/18  Purohit, Konrad Dolores, MD  doxycycline (VIBRAMYCIN) 100 MG capsule Take 1 capsule (100 mg total) by mouth 2 (two) times daily. 04/05/19   Lawyer, Harrell Gave, PA-C  famotidine (PEPCID) 20 MG tablet Take 1 tablet (20 mg total) by mouth 2 (two) times daily. Patient not taking: Reported on 04/10/2019 02/28/18 03/30/18  Purohit, Konrad Dolores, MD  insulin aspart (NOVOLOG FLEXPEN) 100 UNIT/ML FlexPen SSI 3x daily with meals Patient taking differently: Inject 0-10 Units into the skin 3 (three) times daily with meals. SSI 3x daily with meals 01/09/18   Gregor Hams, MD  insulin degludec (TRESIBA FLEXTOUCH) 100 UNIT/ML SOPN FlexTouch Pen Inject 0.3 mLs (30 Units total) into the skin daily. Patient not taking: Reported on 04/10/2019 01/09/18   Gregor Hams, MD  Insulin Detemir (LEVEMIR) 100 UNIT/ML Pen Inject 28 Units into the skin daily. 03/17/19   [provider]  Insulin Pen Needle (ASSURE ID SAFETY PEN NEEDLES) 31G X 5 MM MISC 1 each by Does not apply route  4 (four) times daily. 01/09/18   Rodolph Bong, MD  metoCLOPramide (REGLAN) 10 MG tablet Take 1 tablet (10 mg total) by mouth every 6 (six) hours as needed for up to 14 days for nausea or vomiting. 04/10/19 04/24/19  Horton, Mayer Masker, MD  mirtazapine (REMERON) 7.5 MG tablet Take 1 tablet (7.5 mg total) by mouth at bedtime. Patient not taking: Reported on 04/10/2019 01/09/18   Rodolph Bong, MD  ondansetron (ZOFRAN ODT) 4 MG disintegrating tablet Take 1 tablet (4 mg total) by mouth every 8 (eight) hours as needed for nausea or  vomiting. 04/05/19   Robinson, [place] N, PA-C    Allergies    Patient has no known allergies.  Review of Systems   Review of Systems  Constitutional:       Per HPI, otherwise negative  HENT:       Per HPI, otherwise negative  Respiratory:       Per HPI, otherwise negative  Cardiovascular:       Per HPI, otherwise negative  Gastrointestinal: Positive for abdominal pain, nausea and vomiting.  Endocrine:       Negative aside from HPI  Genitourinary:       Neg aside from HPI   Musculoskeletal:       Per HPI, otherwise negative  Skin: Negative.   Allergic/Immunologic: Negative for immunocompromised state.  Neurological: Positive for weakness. Negative for syncope.    Physical Exam Updated Vital Signs BP (!) 131/100   Pulse (!) 104   Temp 98.2 F (36.8 C) (Oral)   Resp 16   SpO2 99%   Physical Exam Vitals and nursing note reviewed.  Constitutional:      Appearance: He is ill-appearing.     Comments: Thin young adult male awake and alert in discomfort.  HENT:     Head: Normocephalic and atraumatic.  Eyes:     Conjunctiva/sclera: Conjunctivae normal.  Cardiovascular:     Rate and Rhythm: Normal rate and regular rhythm.  Pulmonary:     Effort: Pulmonary effort is normal. No respiratory distress.     Breath sounds: No stridor.  Abdominal:     General: There is no distension.     Tenderness: There is abdominal tenderness in the epigastric area. There is guarding.  Skin:    General: Skin is warm and dry.  Neurological:     Mental Status: He is alert and oriented to person, place, and time.     ED Results / Procedures / Treatments   Labs (all labs ordered are listed, but only abnormal results are displayed) Labs Reviewed  COMPREHENSIVE METABOLIC PANEL - Abnormal; Notable for the following components:      Result Value   Sodium 132 (*)    Potassium 2.9 (*)    Chloride 80 (*)    CO2 37 (*)    Glucose, Bld 233 (*)    BUN 23 (*)    All other components within  normal limits  CBC - Abnormal; Notable for the following components:   WBC 11.6 (*)    Platelets 426 (*)    All other components within normal limits  LIPASE, BLOOD  URINALYSIS, ROUTINE W REFLEX MICROSCOPIC    EKG None  Radiology No results found.  Procedures Procedures (including critical care time)  Medications Ordered in ED Medications  sodium chloride flush (NS) 0.9 % injection 3 mL (has no administration in time range)  famotidine (PEPCID) IVPB 20 mg premix (has no administration in time range)  haloperidol lactate (HALDOL) injection 2 mg (has no administration in time range)  0.9 % NaCl with KCl 20 mEq/ L  infusion (has no administration in time range)    ED Course  I have reviewed the triage vital signs and the nursing notes.  Pertinent labs & imaging results that were available during my care of the patient were reviewed by me and considered in my medical decision making (see chart for details). 8:43 PM Patient sleeping   9:56 PM Patient awake, alert, speaking clearly, in no distress, hemodynamically unremarkable. He has received fluids including potassium repletion. He now requests discharge. Though he has not completed his resuscitation, is capacity to make this request. Findings were notable for dehydration with ketonuria, hyperglycemia, with mild anion gap, and with his description of abdominal pain or suspicion for gastroparesis/hyperglycemic state with dehydration.  Patient has proved substantially, and his request for discharge was accommodated.   Final Clinical Impression(s) / ED Diagnoses Final diagnoses:  Generalized abdominal pain  Dehydration     Gerhard Munch, MD 07/07/19 2158

## 2019-07-08 ENCOUNTER — Emergency Department (HOSPITAL_COMMUNITY)
Admission: EM | Admit: 2019-07-08 | Discharge: 2019-07-08 | Discharge status: Against Medical Advice | Payer: Medicaid Other

## 2023-12-27 DEATH — deceased
